# Patient Record
Sex: Female | Born: 1985 | Race: White | Hispanic: No | Marital: Married | Smoking: Never smoker
Health system: Southern US, Community
[De-identification: ages and names within clinical notes are randomized; demographics above are authoritative.]

## PROBLEM LIST (undated history)

## (undated) DIAGNOSIS — Z789 Other specified health status: Secondary | ICD-10-CM

## (undated) DIAGNOSIS — Z3483 Encounter for supervision of other normal pregnancy, third trimester: Secondary | ICD-10-CM

## (undated) DIAGNOSIS — R7982 Elevated C-reactive protein (CRP): Secondary | ICD-10-CM

## (undated) DIAGNOSIS — R000 Tachycardia, unspecified: Secondary | ICD-10-CM

## (undated) DIAGNOSIS — M791 Myalgia, unspecified site: Secondary | ICD-10-CM

## (undated) DIAGNOSIS — I712 Thoracic aortic aneurysm, without rupture, unspecified: Secondary | ICD-10-CM

## (undated) DIAGNOSIS — R0789 Other chest pain: Secondary | ICD-10-CM

## (undated) HISTORY — DX: Myalgia, unspecified site: M79.10

## (undated) HISTORY — DX: Other chest pain: R07.89

## (undated) HISTORY — DX: Thoracic aortic aneurysm, without rupture: I71.2

## (undated) HISTORY — DX: Thoracic aortic aneurysm, without rupture, unspecified: I71.20

## (undated) HISTORY — DX: Tachycardia, unspecified: R00.0

## (undated) HISTORY — DX: Other specified health status: Z78.9

## (undated) HISTORY — DX: Elevated C-reactive protein (CRP): R79.82

## (undated) HISTORY — PX: WISDOM TOOTH EXTRACTION: SHX21

---

## 2009-02-28 ENCOUNTER — Ambulatory Visit: Payer: Self-pay | Admitting: Obstetrics & Gynecology

## 2009-02-28 ENCOUNTER — Encounter: Payer: Self-pay | Admitting: Obstetrics & Gynecology

## 2009-03-02 ENCOUNTER — Ambulatory Visit: Payer: Self-pay | Admitting: Family Medicine

## 2009-03-09 ENCOUNTER — Ambulatory Visit: Payer: Self-pay | Admitting: Family Medicine

## 2009-04-02 LAB — CONVERTED CEMR LAB
ALT: 17 units/L (ref 0–35)
AST: 20 units/L (ref 0–37)
Alkaline Phosphatase: 63 units/L (ref 39–117)
BUN: 11 mg/dL (ref 6–23)
Basophils Relative: 0.1 % (ref 0.0–3.0)
Bilirubin, Direct: 0 mg/dL (ref 0.0–0.3)
Calcium: 9.4 mg/dL (ref 8.4–10.5)
Chloride: 103 meq/L (ref 96–112)
Cholesterol: 185 mg/dL (ref 0–200)
Creatinine, Ser: 0.7 mg/dL (ref 0.4–1.2)
Eosinophils Relative: 3.4 % (ref 0.0–5.0)
Free T4: 0.8 ng/dL (ref 0.6–1.6)
GFR calc non Af Amer: 109.91 mL/min (ref >60)
LDL Cholesterol: 104 mg/dL — ABNORMAL HIGH (ref 0–99)
Lymphocytes Relative: 48.4 % — ABNORMAL HIGH (ref 12.0–46.0)
Monocytes Absolute: 0.7 10*3/uL (ref 0.1–1.0)
Monocytes Relative: 8.1 % (ref 3.0–12.0)
Neutrophils Relative %: 40 % — ABNORMAL LOW (ref 43.0–77.0)
Platelets: 224 10*3/uL (ref 150.0–400.0)
RBC: 4.57 M/uL (ref 3.87–5.11)
Total Bilirubin: 0.7 mg/dL (ref 0.3–1.2)
Total CHOL/HDL Ratio: 3
Total Protein: 7.4 g/dL (ref 6.0–8.3)
Triglycerides: 86 mg/dL (ref 0.0–149.0)
VLDL: 17.2 mg/dL (ref 0.0–40.0)
WBC: 8.2 10*3/uL (ref 4.5–10.5)

## 2009-08-22 ENCOUNTER — Ambulatory Visit: Payer: Self-pay | Admitting: Family Medicine

## 2009-08-22 ENCOUNTER — Encounter: Payer: Self-pay | Admitting: Obstetrics & Gynecology

## 2009-08-22 LAB — CONVERTED CEMR LAB
Antibody Screen: NEGATIVE
Basophils Relative: 0 % (ref 0–1)
Eosinophils Relative: 1 % (ref 0–5)
HCT: 42.6 % (ref 36.0–46.0)
Hemoglobin: 14.4 g/dL (ref 12.0–15.0)
Hepatitis B Surface Ag: NEGATIVE
MCHC: 33.8 g/dL (ref 30.0–36.0)
Monocytes Absolute: 0.6 10*3/uL (ref 0.1–1.0)
Monocytes Relative: 6 % (ref 3–12)
Neutro Abs: 8.2 10*3/uL — ABNORMAL HIGH (ref 1.7–7.7)
RBC: 4.76 M/uL (ref 3.87–5.11)
Rh Type: POSITIVE
hCG, Beta Chain, Quant, S: 1140.1 milliintl units/mL

## 2009-09-03 ENCOUNTER — Ambulatory Visit (HOSPITAL_COMMUNITY): Admission: RE | Admit: 2009-09-03 | Discharge: 2009-09-03 | Payer: Self-pay | Admitting: Family Medicine

## 2009-09-04 ENCOUNTER — Ambulatory Visit: Payer: Self-pay | Admitting: Obstetrics & Gynecology

## 2009-09-04 ENCOUNTER — Encounter: Payer: Self-pay | Admitting: Obstetrics & Gynecology

## 2009-09-27 ENCOUNTER — Ambulatory Visit: Payer: Self-pay | Admitting: Obstetrics & Gynecology

## 2009-10-16 ENCOUNTER — Ambulatory Visit (HOSPITAL_COMMUNITY): Admission: RE | Admit: 2009-10-16 | Discharge: 2009-10-16 | Payer: Self-pay | Admitting: Obstetrics & Gynecology

## 2009-10-25 ENCOUNTER — Ambulatory Visit: Payer: Self-pay | Admitting: Obstetrics and Gynecology

## 2009-11-07 ENCOUNTER — Ambulatory Visit (HOSPITAL_COMMUNITY): Admission: RE | Admit: 2009-11-07 | Discharge: 2009-11-07 | Payer: Self-pay | Admitting: Obstetrics & Gynecology

## 2009-11-22 ENCOUNTER — Ambulatory Visit (HOSPITAL_COMMUNITY): Admission: RE | Admit: 2009-11-22 | Discharge: 2009-11-22 | Payer: Self-pay | Admitting: Obstetrics & Gynecology

## 2010-04-16 ENCOUNTER — Encounter (INDEPENDENT_AMBULATORY_CARE_PROVIDER_SITE_OTHER): Payer: Self-pay | Admitting: Obstetrics and Gynecology

## 2010-04-16 ENCOUNTER — Inpatient Hospital Stay (HOSPITAL_COMMUNITY): Admission: AD | Admit: 2010-04-16 | Discharge: 2010-04-18 | Payer: Self-pay | Admitting: Obstetrics and Gynecology

## 2011-01-03 ENCOUNTER — Ambulatory Visit
Admission: RE | Admit: 2011-01-03 | Discharge: 2011-01-03 | Payer: Self-pay | Source: Home / Self Care | Attending: Family Medicine | Admitting: Family Medicine

## 2011-01-03 DIAGNOSIS — R519 Headache, unspecified: Secondary | ICD-10-CM | POA: Insufficient documentation

## 2011-01-03 DIAGNOSIS — R51 Headache: Secondary | ICD-10-CM | POA: Insufficient documentation

## 2011-01-16 NOTE — Assessment & Plan Note (Signed)
Summary: FREQUENT HEADACHES / LFW   Vital Signs:  Patient profile:   25 year old female Height:      64 inches Weight:      116.75 pounds BMI:     20.11 Temp:     98.1 degrees F oral Pulse rate:   64 / minute Pulse rhythm:   regular BP sitting:   116 / 70  (left arm) Cuff size:   regular  Vitals Entered By: Selena Batten Dance CMA Duncan Dull) (January 03, 2011 3:04 PM) CC: Frequent headaches   History of Present Illness: has had a headache since last friday --  went away yesterday  last friday got it at work  not a lot she can take - is nursing  can only take tylenol  is not stopped up or congested  no fever , no other symptoms  no n/v  no photophobia got a little dizzy on 2 occasions   thought she got enough fluids -- 3-4 tumber glasses   headache sometimes comes and goes  frontal and behind eyes - bilateral  not throbbing  not severe   occ one sided when really mild  never had migraine  baby is 8 months   is having 2nd peroid - last week and cramped last night and some spotting  baby is not a good sleeper   Allergies (verified): No Known Drug Allergies  Past History:  Past Medical History: Last updated: 03/02/2009 none  Past Surgical History: Last updated: 03/02/2009 none  Family History: Last updated: 03/02/2009 F, CAD, MI at 51, AICD, CVA PGF, d/c MI at 88 M: Stage IV Melanoma Uncle, MC replacement HTN CHOL  Social History: Last updated: 03/02/2009 Married, husband Drew. 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 Eyes:  Denies blurring, double vision, and eye irritation. ENT:  Denies nasal congestion and sinus pressure. CV:  Denies chest pain or discomfort and palpitations. Resp:  Denies cough and shortness of breath. GI:  Denies abdominal pain,  change in bowel habits, nausea, and vomiting. MS:  Denies joint pain, joint redness, and joint swelling. Derm:  Denies lesion(s), poor wound healing, and rash. Neuro:  Complains of headaches; denies difficulty with concentration, disturbances in coordination, falling down, memory loss, numbness, sensation of room spinning, tingling, visual disturbances, and weakness. Psych:  Denies anxiety and depression. Endo:  Denies excessive thirst and excessive urination. Heme:  Denies abnormal bruising and bleeding.  Physical Exam  General:  slim and well apprearing  without headache today Head:  normocephalic, atraumatic, and no abnormalities observed.   Eyes:  vision grossly intact, pupils equal, pupils round, and pupils reactive to light.  no conjunctival pallor, injection or icterus  Ears:  R ear normal and L ear normal.   Nose:  no nasal discharge.   Mouth:  pharynx pink and moist.   Neck:  supple with full rom and no masses or thyromegally, no JVD or carotid bruit  Chest Wall:  No deformities, masses, or tenderness noted. Lungs:  Normal respiratory effort, chest expands symmetrically. Lungs are clear to auscultation, no crackles or wheezes. Heart:  Normal rate and regular rhythm. S1 and S2 normal without gallop, murmur, click, rub or other extra sounds. Msk:  No deformity or scoliosis noted of thoracic or lumbar spine.  no acute joint changes  Extremities:  No clubbing, cyanosis, edema, or deformity noted with normal full range of motion of all joints.   Neurologic:  No cranial nerve deficits noted. Station and gait are normal. Plantar reflexes are down-going bilaterally. DTRs are symmetrical throughout. Sensory, motor and coordinative functions appear intact. Skin:  Intact without suspicious lesions or rashes Cervical Nodes:  No lymphadenopathy noted Psych:  normal affect, talkative and pleasant  not seemingly depressed or anxious    Impression & Recommendations:  Problem # 1:  HEADACHE  (ICD-784.0) Assessment New I do think this is a mixed picture with several triggers (incl dehydration/ fatigue/ eye strain/ hormonal flux)/ analgesic rebound  not a lot of migriane features given handout on lifestyle change aafp  inc water to at least 10 glasses a day while nursing update if worse or new symptoms   Complete Medication List: 1)  Prenatal/folic Acid Tabs (Prenatal vit-fe fumarate-fa) .Marland Kitchen.. 1 by mouth once daily   Patient Instructions: 1)  drink more water-- aim for 10 of those glasses per day 2)  avoid caffine as a rule  3)  aim for more sleep  4)  tylenol is ok occasionally  5)  there may be hormones involved    Orders Added: 1)  Est. Patient Level IV [96045]    Current Allergies (reviewed today): No known allergies

## 2011-03-04 LAB — COMPREHENSIVE METABOLIC PANEL
BUN: 2 mg/dL — ABNORMAL LOW (ref 6–23)
Calcium: 9.5 mg/dL (ref 8.4–10.5)
Glucose, Bld: 102 mg/dL — ABNORMAL HIGH (ref 70–99)
Total Protein: 6.5 g/dL (ref 6.0–8.3)

## 2011-03-04 LAB — CBC
HCT: 32.7 % — ABNORMAL LOW (ref 36.0–46.0)
HCT: 38.5 % (ref 36.0–46.0)
Hemoglobin: 11.1 g/dL — ABNORMAL LOW (ref 12.0–15.0)
MCV: 92 fL (ref 78.0–100.0)
Platelets: 304 10*3/uL (ref 150–400)
Platelets: 342 10*3/uL (ref 150–400)
RDW: 13.2 % (ref 11.5–15.5)
WBC: 27.3 10*3/uL — ABNORMAL HIGH (ref 4.0–10.5)

## 2011-03-04 LAB — URINALYSIS, DIPSTICK ONLY
Glucose, UA: NEGATIVE mg/dL
Leukocytes, UA: NEGATIVE
Protein, ur: NEGATIVE mg/dL
Specific Gravity, Urine: 1.015 (ref 1.005–1.030)
pH: 7 (ref 5.0–8.0)

## 2011-03-04 LAB — LACTATE DEHYDROGENASE: LDH: 232 U/L (ref 94–250)

## 2011-03-04 LAB — URIC ACID: Uric Acid, Serum: 4.3 mg/dL (ref 2.4–7.0)

## 2011-04-29 NOTE — Assessment & Plan Note (Signed)
Brandy Ramirez, Brandy Ramirez               ACCOUNT NO.:  [id number]   MEDICAL RECORD NO.:  [id number]          PATIENT TYPE:  POB   LOCATION:  [facility] at [facility]         FACILITY:  [facility]   PHYSICIAN:  Johnella Moloney, MD        DATE OF BIRTH:  10/27/1986   DATE OF SERVICE:                                  CLINIC NOTE   CHIEF COMPLAINT:  The patient is here for annual examination.   HISTORY OF PRESENT ILLNESS:  The patient is a 25 year old, gravida 0,  with a last menstrual period of February 01, 2009, who is here for her  annual physical examination and initiation of gynecologic care.  The  patient has no gynecologic concerns.  She is currently trying to get  pregnant and has been off of any contraceptions in the past 3 months.  The patient is taking multivitamin daily.  She has a history of being  vaccinated for chickenpox and has no other concerns.  Since her  cessation of contraception, she has had periods that have come between  28 days and 35 days.  Her last period according to her was 1 week late,  but her periods last 2-3 days and are characterized by a light flow and  associated with moderate cramping, which is controlled using Motrin.  The patient has no intermenstrual bleeding or any other concerns.   PAST OB/GYN HISTORY:  As stated in the HPI.  Her menarche was at age 13.  She has used oral contraceptive pills in the past for contraception.  The patient is only sexually active with her husband.  Her last Pap  smear was in 2008 and was normal.  She has never had a history of  cervical dysplasia or she and her husband are each other is only sexual  partners.   PAST MEDICAL HISTORY:  None.   PAST SURGICAL HISTORY:  None.   MEDICATIONS:  Multivitamins daily.   ALLERGIES:  No known drug allergies.   SOCIAL HISTORY:  The patient lives with her husband.  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  She denies any smoking, alcohol, or  illicit drug use.  She also denies any past  or current history of sexual  physical or emotional abuse.   REVIEW OF SYSTEMS:  A 14-point comprehensive review of systems was  discussed with the patient and was entirely negative.   PHYSICAL EXAMINATION:  VITAL SIGNS:  Blood pressure 138/91, pulse 72,  and weight 115 pounds.  GENERAL:  No apparent distress.  HEENT:  Normocephalic, atraumatic.  NECK:  Supple.  No masses.  Normal thyroid.  BREASTS:  Symmetric in size and nontender.  No abnormal masses,  drainage, skin changes, or lymphadenopathy noted.  HEART:  Regular rate and rhythm.  LUNGS:  Clear to auscultation bilaterally.  ABDOMEN:  Soft, nontender, and nondistended.  EXTREMITIES:  No cyanosis, clubbing, or edema.  PELVIC:  Normal external female genitalia.  Pink and well rugated  vagina.  Nulliparous cervix.  No lesions seen.  Normal discharge.  Pap  smear was obtained.  BIMANUAL:  The patient has a normal mobile uterus and normal adnexa  bilaterally  and no tenderness on examination.   ASSESSMENT AND PLAN:  The patient is a 25 year old, gravida 0, here for  her annual physical examination.  The patient underwent a Pap smear  today.  We will follow up results.  She also had a normal breast  examination.  Of note on family history, it is notable for heart disease  and skin cancer.  There is no history of any breast, gynecologic, or  colon cancers in her family.  The patient is also trying to conceive.  She was told to continue to take a multivitamins daily and she has been  vaccinated for chicken pox and has no other preconceptual concerns.  She  was told to call and make that clinic appointment once she is pregnant.  She initiate prenatal care and to also call for any other obstetric or  gynecologic concerns.  Given her age, the patient was informed and  counseled about the availability of the Gardasil vaccine; however, given  that she and her husband are each other is only sexual partner.  Her  likelihood of contacting  HPV is very, very low.  The patient will think  about this and make a decision about vaccine.           ______________________________  Johnella Moloney, MD     UD/MEDQ  D:  02/28/2009  T:  02/28/2009  Job:  [id number]

## 2011-06-30 ENCOUNTER — Ambulatory Visit (INDEPENDENT_AMBULATORY_CARE_PROVIDER_SITE_OTHER): Payer: 59 | Admitting: Family Medicine

## 2011-06-30 ENCOUNTER — Encounter: Payer: Self-pay | Admitting: Family Medicine

## 2011-06-30 VITALS — BP 100/66 | HR 71 | Temp 98.4°F | Ht 63.0 in | Wt 114.1 lb

## 2011-06-30 DIAGNOSIS — R197 Diarrhea, unspecified: Secondary | ICD-10-CM

## 2011-06-30 DIAGNOSIS — Z331 Pregnant state, incidental: Secondary | ICD-10-CM

## 2011-06-30 NOTE — Progress Notes (Signed)
Brandy Ramirez, a 25 y.o. female presents today in the office for the following:   [redacted] weeks pregnant  Almost a week and a half, has had severe abdominal cramps, called OB. No immodium per rec Daughter had some illness and bad diarrhea. Daughter had some diarrhea for 2-3 weeks.   For patient: Manson Passey yellow, soft -- poorly formed. All started and was green and shiny.  No camping or other exposures. 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  Works with [employer]  No n/v. Drinking very easily.  Patient Active Problem List  Diagnoses  . HEADACHE   No past medical history on file. No past surgical history on file. History  Substance Use Topics  . Smoking status: Never Smoker   . Smokeless tobacco: Not on file  . Alcohol Use: No   Family History  Problem Relation Age of Onset  . Cancer Mother     stage IV MELANOMA  . Coronary artery disease Father   . Heart attack Father   . Stroke Father   . Heart attack Paternal Grandfather    No Known Allergies No current outpatient prescriptions on file prior to visit.   ROS: GEN: no fevers, chills. GI: above Pulm: No SOB Interactive and getting along well at home.  Otherwise, ROS is as per the HPI.    Physical Exam  Blood pressure 100/66, pulse 71, temperature 98.4 F (36.9 C), temperature source Oral, height 5\' 3"  (1.6 m), weight 114 lb 1.9 oz (51.764 kg), SpO2 100.00%.  GEN: WDWN, NAD, Non-toxic, A & O x 3 HEENT: Atraumatic, Normocephalic. Neck supple. No masses, No LAD. Ears and Nose: No external deformity. ABD: S, NT, ND, +BS. No rebound tenderness. No HSM.  EXTR: No c/c/e NEURO Normal gait.  PSYCH: Normally interactive. Conversant. Not depressed or anxious appearing.  Calm demeanor.   Assessment and Plan: 1.  Diarrhea. Unclear cause. Most likely infectious. Given pregnancy, higher level of concern - check stool studies. No camping exposure. If all negative, would let go its course. Push fluids.

## 2011-07-03 LAB — FECAL LACTOFERRIN, QUANT: Lactoferrin: NEGATIVE

## 2011-07-06 LAB — STOOL CULTURE

## 2011-07-09 LAB — GC/CHLAMYDIA PROBE AMP, GENITAL: Chlamydia: NEGATIVE

## 2011-08-21 ENCOUNTER — Other Ambulatory Visit (HOSPITAL_COMMUNITY): Payer: Self-pay | Admitting: Obstetrics and Gynecology

## 2011-08-21 DIAGNOSIS — Z3689 Encounter for other specified antenatal screening: Secondary | ICD-10-CM

## 2011-08-22 ENCOUNTER — Ambulatory Visit (HOSPITAL_COMMUNITY): Admission: RE | Admit: 2011-08-22 | Payer: 59 | Source: Ambulatory Visit

## 2011-08-22 ENCOUNTER — Ambulatory Visit (HOSPITAL_COMMUNITY): Payer: 59

## 2011-12-16 NOTE — L&D Delivery Note (Signed)
Delivery Note Pt had SROM, continued to progress to complete and pushed very well.  At 11:52 PM a viable female was delivered via Vaginal, Spontaneous Delivery (Presentation: ; Occiput Anterior).  APGAR: 9, 9; weight 7 lbs 3 oz.   Placenta status: Intact, Spontaneous.  Cord: 3 vessels with the following complications: None.  Anesthesia: Local  Episiotomy: None Lacerations: 2nd degree Suture Repair: 2.0 vicryl Est. Blood Loss (mL): 400  Mom to postpartum.  Baby to nursery-stable.  Brandy Ramirez D 01/19/2012, 12:13 AM

## 2012-01-12 LAB — STREP B DNA PROBE: GBS: POSITIVE

## 2012-01-16 ENCOUNTER — Telehealth (HOSPITAL_COMMUNITY): Payer: Self-pay | Admitting: *Deleted

## 2012-01-16 ENCOUNTER — Encounter (HOSPITAL_COMMUNITY): Payer: Self-pay | Admitting: *Deleted

## 2012-01-16 NOTE — Telephone Encounter (Signed)
Preadmission screen  

## 2012-01-18 ENCOUNTER — Encounter (HOSPITAL_COMMUNITY): Payer: Self-pay | Admitting: *Deleted

## 2012-01-18 ENCOUNTER — Inpatient Hospital Stay (HOSPITAL_COMMUNITY)
Admission: AD | Admit: 2012-01-18 | Discharge: 2012-01-20 | DRG: 775 | Disposition: A | Discharge status: Home / Self Care | Payer: 59 | Source: Ambulatory Visit | Attending: Obstetrics and Gynecology | Admitting: Obstetrics and Gynecology

## 2012-01-18 DIAGNOSIS — IMO0001 Reserved for inherently not codable concepts without codable children: Secondary | ICD-10-CM

## 2012-01-18 DIAGNOSIS — Z22330 Carrier of Group B streptococcus: Secondary | ICD-10-CM

## 2012-01-18 DIAGNOSIS — O99892 Other specified diseases and conditions complicating childbirth: Secondary | ICD-10-CM | POA: Diagnosis present

## 2012-01-18 LAB — CBC
Hemoglobin: 13.5 g/dL (ref 12.0–15.0)
MCHC: 33.9 g/dL (ref 30.0–36.0)
RBC: 4.33 MIL/uL (ref 3.87–5.11)
WBC: 16.9 10*3/uL — ABNORMAL HIGH (ref 4.0–10.5)

## 2012-01-18 MED ORDER — IBUPROFEN 600 MG PO TABS
600.0000 mg | ORAL_TABLET | Freq: Four times a day (QID) | ORAL | Status: DC | PRN
  Administered 2012-01-19: 600 mg via ORAL
  Filled 2012-01-18: qty 1

## 2012-01-18 MED ORDER — LIDOCAINE HCL (PF) 1 % IJ SOLN
30.0000 mL | INTRAMUSCULAR | Status: DC | PRN
  Administered 2012-01-18: 30 mL via SUBCUTANEOUS
  Filled 2012-01-18: qty 30

## 2012-01-18 MED ORDER — OXYTOCIN 20 UNITS IN LACTATED RINGERS INFUSION - SIMPLE
125.0000 mL/h | Freq: Once | INTRAVENOUS | Status: AC
  Administered 2012-01-18: 999 mL/h via INTRAVENOUS

## 2012-01-18 MED ORDER — CITRIC ACID-SODIUM CITRATE 334-500 MG/5ML PO SOLN: 30.0000 mL | ORAL | Status: DC | PRN

## 2012-01-18 MED ORDER — ONDANSETRON HCL 4 MG/2ML IJ SOLN: 4.0000 mg | Freq: Four times a day (QID) | INTRAMUSCULAR | Status: DC | PRN

## 2012-01-18 MED ORDER — ACETAMINOPHEN 325 MG PO TABS: 650.0000 mg | ORAL_TABLET | ORAL | Status: DC | PRN

## 2012-01-18 MED ORDER — PENICILLIN G POTASSIUM 5000000 UNITS IJ SOLR
2.5000 10*6.[IU] | INTRAVENOUS | Status: DC
  Administered 2012-01-18: 2.5 10*6.[IU] via INTRAVENOUS
  Filled 2012-01-18 (×5): qty 2.5

## 2012-01-18 MED ORDER — FLEET ENEMA 7-19 GM/118ML RE ENEM: 1.0000 | ENEMA | RECTAL | Status: DC | PRN

## 2012-01-18 MED ORDER — OXYTOCIN BOLUS FROM INFUSION
500.0000 mL | Freq: Once | INTRAVENOUS | Status: DC
  Filled 2012-01-18: qty 1000
  Filled 2012-01-18: qty 500

## 2012-01-18 MED ORDER — PENICILLIN G POTASSIUM 5000000 UNITS IJ SOLR
5.0000 10*6.[IU] | Freq: Once | INTRAMUSCULAR | Status: AC
  Administered 2012-01-18: 5 10*6.[IU] via INTRAVENOUS
  Filled 2012-01-18: qty 5

## 2012-01-18 MED ORDER — OXYCODONE-ACETAMINOPHEN 5-325 MG PO TABS
1.0000 | ORAL_TABLET | ORAL | Status: DC | PRN
  Administered 2012-01-19: 1 via ORAL
  Filled 2012-01-18: qty 1

## 2012-01-18 MED ORDER — LACTATED RINGERS IV SOLN: 500.0000 mL | INTRAVENOUS | Status: DC | PRN

## 2012-01-18 MED ORDER — LACTATED RINGERS IV SOLN
INTRAVENOUS | Status: DC
  Administered 2012-01-18: 20:00:00 via INTRAVENOUS

## 2012-01-18 NOTE — H&P (Signed)
Brandy Ramirez is a 26 y.o. female, G2 P1001, EGA 39+ weeks presenting for evaluation of regular ctx.  On eval in MAU, cervix changed from 2 to 4 cm with reg ctx.  Prenatal care essentially uncomplicated, see prenatal records for complete history.  Maternal Medical History:  Reason for admission: Reason for admission: contractions.  Contractions: Frequency: regular.   Perceived severity is moderate.    Fetal activity: Perceived fetal activity is normal.    Prenatal complications: no prenatal complications   OB History    Grav Para Term Preterm Abortions TAB SAB Ect Mult Living   2 1 1       1     SVD at 38 weeks, 7 lbs, no complications  Past Medical History  Diagnosis Date  . No pertinent past medical history    Past Surgical History  Procedure Date  . No past surgeries    Family History: family history includes Birth defects in her daughter; Cancer in her father, maternal grandmother, and mother; Coronary artery disease in her father; Heart attack in her father and paternal grandfather; and Stroke in her father. Social History:  reports that she has never smoked. She has never used smokeless tobacco. She reports that she does not drink alcohol or use illicit drugs.  Review of Systems  Respiratory: Negative.   Cardiovascular: Negative.     Dilation: 4 Effacement (%): 80 Station: -2 Exam by:: [employer] RN Blood pressure 142/92, pulse 107, temperature 98.5 F (36.9 C), temperature source Oral, resp. rate 20, height 5\' 3"  (1.6 m), weight 66.407 kg (146 lb 6.4 oz), last menstrual period 07/09/2011. Maternal Exam:  Uterine Assessment: Contraction strength is moderate.  Contraction frequency is regular.   Abdomen: Patient reports no abdominal tenderness. Estimated fetal weight is 7 lbs.   Fetal presentation: vertex  Introitus: Normal vulva. Normal vagina.  Pelvis: adequate for delivery.   Cervix: Cervix evaluated by digital exam.     Fetal Exam Fetal Monitor Review: Mode:  ultrasound.   Baseline rate: 130.  Variability: moderate (6-25 bpm).   Pattern: accelerations present and no decelerations.    Fetal State Assessment: Category I - tracings are normal.     Physical Exam  Constitutional: She appears well-developed and well-nourished.  Cardiovascular: Normal rate, regular rhythm and normal heart sounds.   No murmur heard. Respiratory: Effort normal and breath sounds normal. No respiratory distress. She has no wheezes.  GI: Soft.       gravid    Prenatal labs: ABO, Rh:  O pos Antibody:  neg Rubella:  Imm RPR: Nonreactive (07/25 0000)  HBsAg:   neg HIV: Non-reactive (07/25 0000)  GBS: Positive (01/28 0000)   Assessment/Plan: IUP at 39 weeks in early active labor, GBS positive.  Will admit and start PCN, AROM after 3 hrs of PCN to allow time for it to take effect.     Brandy Ramirez 01/18/2012, 8:19 PM

## 2012-01-18 NOTE — Progress Notes (Signed)
Pt reports having contracts since 1430 . More regular now q 3-4 min. Deneis SROM  Or bleeding at this time. Reports good fetal movement.

## 2012-01-19 ENCOUNTER — Encounter (HOSPITAL_COMMUNITY): Payer: Self-pay | Admitting: *Deleted

## 2012-01-19 LAB — RPR: RPR Ser Ql: NONREACTIVE

## 2012-01-19 MED ORDER — BENZOCAINE-MENTHOL 20-0.5 % EX AERO
INHALATION_SPRAY | CUTANEOUS | Status: AC
  Administered 2012-01-19: 1 via TOPICAL
  Filled 2012-01-19: qty 56

## 2012-01-19 MED ORDER — ONDANSETRON HCL 4 MG PO TABS: 4.0000 mg | ORAL_TABLET | ORAL | Status: DC | PRN

## 2012-01-19 MED ORDER — WITCH HAZEL-GLYCERIN EX PADS: 1.0000 "application " | MEDICATED_PAD | CUTANEOUS | Status: DC | PRN

## 2012-01-19 MED ORDER — SIMETHICONE 80 MG PO CHEW: 80.0000 mg | CHEWABLE_TABLET | ORAL | Status: DC | PRN

## 2012-01-19 MED ORDER — DIBUCAINE 1 % RE OINT: 1.0000 "application " | TOPICAL_OINTMENT | RECTAL | Status: DC | PRN

## 2012-01-19 MED ORDER — MAGNESIUM HYDROXIDE 400 MG/5ML PO SUSP: 30.0000 mL | ORAL | Status: DC | PRN

## 2012-01-19 MED ORDER — MEASLES, MUMPS & RUBELLA VAC ~~LOC~~ INJ
0.5000 mL | INJECTION | Freq: Once | SUBCUTANEOUS | Status: DC
  Filled 2012-01-19: qty 0.5

## 2012-01-19 MED ORDER — LANOLIN HYDROUS EX OINT: TOPICAL_OINTMENT | CUTANEOUS | Status: DC | PRN

## 2012-01-19 MED ORDER — BENZOCAINE-MENTHOL 20-0.5 % EX AERO
1.0000 "application " | INHALATION_SPRAY | CUTANEOUS | Status: DC | PRN
  Administered 2012-01-19: 1 via TOPICAL

## 2012-01-19 MED ORDER — PRENATAL MULTIVITAMIN CH
1.0000 | ORAL_TABLET | Freq: Every day | ORAL | Status: DC
  Administered 2012-01-19 – 2012-01-20 (×2): 1 via ORAL
  Filled 2012-01-19 (×2): qty 1

## 2012-01-19 MED ORDER — SENNOSIDES-DOCUSATE SODIUM 8.6-50 MG PO TABS
2.0000 | ORAL_TABLET | Freq: Every day | ORAL | Status: DC
  Administered 2012-01-19: 2 via ORAL

## 2012-01-19 MED ORDER — POVIDONE-IODINE 10 % EX OINT
TOPICAL_OINTMENT | CUTANEOUS | Status: AC
  Filled 2012-01-19: qty 28.35

## 2012-01-19 MED ORDER — OXYTOCIN 20 UNITS IN LACTATED RINGERS INFUSION - SIMPLE: 125.0000 mL/h | INTRAVENOUS | Status: DC | PRN

## 2012-01-19 MED ORDER — METHYLERGONOVINE MALEATE 0.2 MG/ML IJ SOLN: 0.2000 mg | INTRAMUSCULAR | Status: DC | PRN

## 2012-01-19 MED ORDER — IBUPROFEN 600 MG PO TABS
600.0000 mg | ORAL_TABLET | Freq: Four times a day (QID) | ORAL | Status: DC
  Administered 2012-01-19 – 2012-01-20 (×5): 600 mg via ORAL
  Filled 2012-01-19 (×5): qty 1

## 2012-01-19 MED ORDER — TETANUS-DIPHTH-ACELL PERTUSSIS 5-2.5-18.5 LF-MCG/0.5 IM SUSP
0.5000 mL | Freq: Once | INTRAMUSCULAR | Status: AC
  Administered 2012-01-20: 0.5 mL via INTRAMUSCULAR
  Filled 2012-01-19: qty 0.5

## 2012-01-19 MED ORDER — DIPHENHYDRAMINE HCL 25 MG PO CAPS: 25.0000 mg | ORAL_CAPSULE | Freq: Four times a day (QID) | ORAL | Status: DC | PRN

## 2012-01-19 MED ORDER — OXYCODONE-ACETAMINOPHEN 5-325 MG PO TABS: 1.0000 | ORAL_TABLET | ORAL | Status: DC | PRN

## 2012-01-19 MED ORDER — METHYLERGONOVINE MALEATE 0.2 MG PO TABS: 0.2000 mg | ORAL_TABLET | ORAL | Status: DC | PRN

## 2012-01-19 MED ORDER — ZOLPIDEM TARTRATE 5 MG PO TABS: 5.0000 mg | ORAL_TABLET | Freq: Every evening | ORAL | Status: DC | PRN

## 2012-01-19 MED ORDER — ONDANSETRON HCL 4 MG/2ML IJ SOLN: 4.0000 mg | INTRAMUSCULAR | Status: DC | PRN

## 2012-01-19 NOTE — Progress Notes (Signed)
PPD #1 No problems Afeb, VSS Fundus firm, NT at U-1 Continue routine postpartum care.  Discussed circumcision procedure and risks last pm after delivery.

## 2012-01-20 ENCOUNTER — Inpatient Hospital Stay (HOSPITAL_COMMUNITY): Admission: RE | Admit: 2012-01-20 | Payer: 59 | Source: Ambulatory Visit

## 2012-01-20 NOTE — Discharge Summary (Signed)
Obstetric Discharge Summary Reason for Admission: onset of labor Prenatal Procedures: none Intrapartum Procedures: spontaneous vaginal delivery Postpartum Procedures: none Complications-Operative and Postpartum: 2nd degree perineal laceration Hemoglobin  Date Value Range Status  01/18/2012 13.5  12.0-15.0 (g/dL) Final     HCT  Date Value Range Status  01/18/2012 39.8  36.0-46.0 (%) Final    Discharge Diagnoses: Term Pregnancy-delivered  Discharge Information: Date: 01/20/2012 Activity: pelvic rest Diet: routine Medications: Ibuprofen Condition: stable Instructions: refer to practice specific booklet Discharge to: home Follow-up Information    Follow up with Omara Alcon D, MD. Schedule an appointment as soon as possible for a visit in 6 weeks.   Contact information:   [address] [place] [address] [phone]          Newborn Data: Live born female  Birth Weight: 7 lb 2.8 oz (3255 g) APGAR: 9, 9  Home with mother.  Anjolina Byrer D 01/20/2012, 10:15 AM

## 2012-01-20 NOTE — Progress Notes (Signed)
PPD #2 No problems Afeb, VSS D/c home 

## 2012-11-05 ENCOUNTER — Telehealth: Payer: Self-pay

## 2012-11-05 NOTE — Telephone Encounter (Signed)
Pt request appt for filling out healthy rewards insurance discount form and will be fasting for labs also. Pt sheduled 11/15/12 at 8:15 am.

## 2012-11-15 ENCOUNTER — Ambulatory Visit: Payer: 59 | Admitting: Family Medicine

## 2012-11-17 ENCOUNTER — Ambulatory Visit: Payer: 59 | Admitting: Family Medicine

## 2012-11-18 ENCOUNTER — Ambulatory Visit (INDEPENDENT_AMBULATORY_CARE_PROVIDER_SITE_OTHER): Payer: 59 | Admitting: Family Medicine

## 2012-11-18 ENCOUNTER — Encounter: Payer: Self-pay | Admitting: Family Medicine

## 2012-11-18 VITALS — BP 100/60 | HR 73 | Temp 99.3°F | Ht 63.0 in | Wt 116.5 lb

## 2012-11-18 DIAGNOSIS — Z131 Encounter for screening for diabetes mellitus: Secondary | ICD-10-CM

## 2012-11-18 DIAGNOSIS — Z13220 Encounter for screening for lipoid disorders: Secondary | ICD-10-CM

## 2012-11-18 DIAGNOSIS — J029 Acute pharyngitis, unspecified: Secondary | ICD-10-CM

## 2012-11-18 LAB — BASIC METABOLIC PANEL
CO2: 29 mEq/L (ref 19–32)
Calcium: 9 mg/dL (ref 8.4–10.5)
Creatinine, Ser: 0.6 mg/dL (ref 0.4–1.2)
Sodium: 138 mEq/L (ref 135–145)

## 2012-11-18 LAB — LIPID PANEL
HDL: 74.4 mg/dL (ref >39.00)
LDL Cholesterol: 89 mg/dL (ref 0–99)
Total CHOL/HDL Ratio: 2

## 2012-11-18 MED ORDER — PENICILLIN V POTASSIUM 500 MG PO TABS: 500.0000 mg | ORAL_TABLET | Freq: Three times a day (TID) | ORAL | Status: DC

## 2012-11-18 NOTE — Progress Notes (Signed)
Nature conservation officer at [facility] [address] [place] [address] Phone: [phone] Fax: [phone]  Date:  11/18/2012   Name:  Chalese Peach   DOB:  [birth year]   MRN:  [id number] Gender: female Age: 26 y.o.  PCP:  Hannah Beat, MD  Evaluating MD: Hannah Beat, MD   Chief Complaint: labs for work   History of Present Illness:  Brandy Ramirez is a 26 y.o. pleasant patient who presents with the following:  The patient has had a relatively severe sore throat over the past few days with an absence of other symptoms including an absence of URI symptoms and does have painful lymph nodes in her neck.  Patient also is here for some lab work screening for her health insurance.  Patient Active Problem List  Diagnosis  . HEADACHE  . Active labor  . SVD (spontaneous vaginal delivery)    Past Medical History  Diagnosis Date  . No pertinent past medical history     Past Surgical History  Procedure Date  . No past surgeries     History  Substance Use Topics  . Smoking status: Never Smoker   . Smokeless tobacco: Never Used  . Alcohol Use: No    Family History  Problem Relation Age of Onset  . Cancer Mother     stage IV MELANOMA  . Coronary artery disease Father   . Heart attack Father   . Stroke Father   . Cancer Father     non hodgkins lymphoma  . Heart attack Paternal Grandfather   . Birth defects Daughter     VSD  . Cancer Maternal Grandmother     colon    No Known Allergies  Medication list has been reviewed and updated.  Outpatient Prescriptions Prior to Visit  Medication Sig Dispense Refill  . Prenatal Vit-Fe Psac Cmplx-FA (PRENATAL MULTIVITAMIN) 60-1 MG tablet Take 1 tablet by mouth daily with breakfast.         Last reviewed on 11/18/2012  9:28 AM by Consuello Masse, CMA  Review of Systems:  ROS: GEN: Acute illness details above GI: Tolerating PO intake GU: maintaining adequate hydration and urination Pulm: No  SOB Interactive and getting along well at home.  Otherwise, ROS is as per the HPI.   Physical Examination: Filed Vitals:   11/18/12 0927  BP: 100/60  Pulse: 73  Temp: 99.3 F (37.4 C)  TempSrc: Oral  Height: 5\' 3"  (1.6 m)  Weight: 116 lb 8 oz (52.844 kg)  SpO2: 98%    Body mass index is 20.64 kg/(m^2). Ideal Body Weight: Weight in (lb) to have BMI = 25: 140.8    Gen: WDWN, NAD; A & O x3, cooperative. Pleasant.Globally Non-toxic HEENT: Normocephalic and atraumatic. Throat: no exudate R TM clear, L TM - good landmarks, No fluid present. rhinnorhea. No frontal or maxillary sinus T. MMM NECK: Anterior cervical  LAD is present - TTP CV: RRR, No M/G/R, cap refill <2 sec PULM: Breathing comfortably in no respiratory distress. no wheezing, crackles, rhonchi ABD: S,NT,ND,+BS. No HSM. No rebound. EXT: No c/c/e PSYCH: Friendly, good eye contact   Assessment and Plan:  1. Sore throat  POCT rapid strep A  2. Screening for lipoid disorders  Lipid panel  3. Screening for diabetes mellitus  Basic metabolic panel   Most likely streptococcal for angitis, could be viral pharyngitis. Given high pretest probability and in Stentor criteria, will go ahead and treat with penicillin. More likely strep test is  falsely negative.  Lab work for work.  Results for orders placed in visit on 11/18/12  POCT RAPID STREP A (OFFICE)      Component Value Range   Rapid Strep A Screen Negative  Negative  LIPID PANEL      Component Value Range   Cholesterol 170  0 - 200 mg/dL   Triglycerides 27.2  0.0 - 149.0 mg/dL   HDL 53.66  >44.03 mg/dL   VLDL 6.2  0.0 - 47.4 mg/dL   LDL Cholesterol 89  0 - 99 mg/dL   Total CHOL/HDL Ratio 2    BASIC METABOLIC PANEL      Component Value Range   Sodium 138  135 - 145 mEq/L   Potassium 3.9  3.5 - 5.1 mEq/L   Chloride 103  96 - 112 mEq/L   CO2 29  19 - 32 mEq/L   Glucose, Bld 83  70 - 99 mg/dL   BUN 8  6 - 23 mg/dL   Creatinine, Ser 0.6  0.4 - 1.2 mg/dL    Calcium 9.0  8.4 - 25.9 mg/dL   GFR 563.87  >56.43 mL/min     Orders Today:  Orders Placed This Encounter  Procedures  . Lipid panel  . Basic metabolic panel  . POCT rapid strep A    Updated Medication List: (Includes new medications, updates to list, dose adjustments) Meds ordered this encounter  Medications  . penicillin v potassium (VEETID) 500 MG tablet    Sig: Take 1 tablet (500 mg total) by mouth 3 (three) times daily.    Dispense:  30 tablet    Refill:  0    Medications Discontinued: There are no discontinued medications.   Hannah Beat, MD

## 2013-08-31 ENCOUNTER — Ambulatory Visit (INDEPENDENT_AMBULATORY_CARE_PROVIDER_SITE_OTHER): Payer: 59 | Admitting: Family Medicine

## 2013-08-31 ENCOUNTER — Encounter: Payer: Self-pay | Admitting: Family Medicine

## 2013-08-31 VITALS — BP 110/80 | HR 65 | Temp 98.4°F | Ht 63.5 in | Wt 116.2 lb

## 2013-08-31 DIAGNOSIS — Z0000 Encounter for general adult medical examination without abnormal findings: Secondary | ICD-10-CM

## 2013-08-31 DIAGNOSIS — Z131 Encounter for screening for diabetes mellitus: Secondary | ICD-10-CM

## 2013-08-31 DIAGNOSIS — Z13220 Encounter for screening for lipoid disorders: Secondary | ICD-10-CM

## 2013-08-31 DIAGNOSIS — Z23 Encounter for immunization: Secondary | ICD-10-CM

## 2013-08-31 LAB — BASIC METABOLIC PANEL
BUN: 8 mg/dL (ref 6–23)
Chloride: 103 mEq/L (ref 96–112)
GFR: 106.06 mL/min (ref >60.00)
Potassium: 4.1 mEq/L (ref 3.5–5.1)
Sodium: 137 mEq/L (ref 135–145)

## 2013-08-31 LAB — LIPID PANEL
LDL Cholesterol: 92 mg/dL (ref 0–99)
Total CHOL/HDL Ratio: 3
Triglycerides: 75 mg/dL (ref 0.0–149.0)

## 2013-08-31 NOTE — Progress Notes (Signed)
Nature conservation officer at [facility] [address] [place] [address] Phone: [phone] Fax: [phone]  Date:  08/31/2013   Name:  Brandy Ramirez   DOB:  Jan 24, 1986   MRN:  [id number] Gender: female Age: 27 y.o.  Primary Physician:  Hannah Beat, MD  Evaluating MD: Hannah Beat, MD   Chief Complaint: Insurance Form   History of Present Illness:  Brandy Ramirez is a 27 y.o. pleasant patient who presents with the following:  3 and 19 months. 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  Health Maintenance Summary Reviewed and updated, unless pt declines services.  Tobacco History Reviewed. Non-smoker Alcohol: No concerns, no excessive use Exercise Habits: Some activity, rec at least 30 mins 5 times a week STD concerns: none Drug Use: None Birth control method: condoms Menses regular: yes Lumps or breast concerns: no Breast Cancer Family History: no  Health Maintenance  Topic Date Due  . Pap Smear  02/13/2012  . Influenza Vaccine  07/15/2013  . Tetanus/tdap  01/19/2022      Patient Active Problem List   Diagnosis Date Noted  . HEADACHE 01/03/2011    Past Medical History  Diagnosis Date  . No pertinent past medical history     Past Surgical History  Procedure Laterality Date  . No past surgeries      History   Social History  . Marital Status: Married    Spouse Name: drew    Number of Children: 0  . Years of Education: N/A   Occupational History  . 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 Smoking status: Never Smoker   . Smokeless tobacco: Never Used  . Alcohol Use: No  . Drug Use: No  . Sexual Activity: Yes   Other Topics Concern  . Not on file   Social History Narrative   Likes to walk    Family History  Problem Relation Age of Onset  . Cancer Mother     stage IV MELANOMA  . Coronary artery disease Father   . Heart attack Father   . Stroke Father   . Cancer Father     non hodgkins  lymphoma  . Heart attack Paternal Grandfather   . Birth defects Daughter     VSD  . Cancer Maternal Grandmother     colon    No Known Allergies  Medication list has been reviewed and updated.  Outpatient Prescriptions Prior to Visit  Medication Sig Dispense Refill  . Prenatal Vit-Fe Psac Cmplx-FA (PRENATAL MULTIVITAMIN) 60-1 MG tablet Take 1 tablet by mouth daily with breakfast.        . penicillin v potassium (VEETID) 500 MG tablet Take 1 tablet (500 mg total) by mouth 3 (three) times daily.  30 tablet  0   No facility-administered medications prior to visit.    Review of Systems:   General: Denies fever, chills, sweats. No significant weight loss. Eyes: Denies blurring,significant itching ENT: Denies earache, sore throat, and hoarseness.  Cardiovascular: Denies chest pains, palpitations, dyspnea on exertion,  Respiratory: Denies cough, dyspnea at rest,wheeezing Breast: no concerns about lumps GI: Denies nausea, vomiting, diarrhea, constipation, change in bowel habits, abdominal pain, melena, hematochezia GU: Denies dysuria, hematuria, urinary hesitancy, nocturia, denies STD risk, no concerns about discharge Musculoskeletal: Denies back pain, joint pain Derm: Denies rash, itching Neuro: Denies  paresthesias, frequent falls, frequent headaches Psych: Denies depression, anxiety Endocrine: Denies cold intolerance, heat intolerance, polydipsia Heme: Denies enlarged lymph nodes  Allergy: No hayfever   Physical Examination: BP 110/80  Pulse 65  Temp(Src) 98.4 F (36.9 C) (Oral)  Ht 5' 3.5" (1.613 m)  Wt 116 lb 4 oz (52.731 kg)  BMI 20.27 kg/m2  LMP 08/13/2013  Breastfeeding? No  Ideal Body Weight: Weight in (lb) to have BMI = 25: 143.1   Wt Readings from Last 3 Encounters:  08/31/13 116 lb 4 oz (52.731 kg)  11/18/12 116 lb 8 oz (52.844 kg)  01/18/12 146 lb 6.4 oz (66.407 kg)    GEN: well developed, well nourished, no acute distress Eyes: conjunctiva and lids  normal, PERRLA, EOMI ENT: TM clear, nares clear, oral exam WNL Neck: supple, no lymphadenopathy, no thyromegaly, no JVD Pulm: clear to auscultation and percussion, respiratory effort normal CV: regular rate and rhythm, S1-S2, no murmur, rub or gallop, no bruits Chest: no scars, masses, no lumps BREAST: defer GI: soft, non-tender; no hepatosplenomegaly, masses; active bowel sounds all quadrants GU: Defer Lymph: no cervical, axillary or inguinal adenopathy MSK: gait normal, muscle tone and strength WNL, no joint swelling, effusions, discoloration, crepitus  SKIN: clear, good turgor, color WNL, no rashes, lesions, or ulcerations Neuro: normal mental status, normal strength, sensation, and motion Psych: alert; oriented to person, place and time, normally interactive and not anxious or depressed in appearance.   Assessment and Plan:  Routine general medical examination at a health care facility  Need for prophylactic vaccination and inoculation against influenza - Plan: Flu Vaccine QUAD 36+ mos PF IM (Fluarix)  Screening for lipoid disorders - Plan: Lipid panel  Screening for diabetes mellitus - Plan: Basic metabolic panel  The patient's preventative maintenance and recommended screening tests for an annual wellness exam were reviewed in full today. Brought up to date unless services declined.  Counselled on the importance of diet, exercise, and its role in overall health and mortality. The patient's FH and SH was reviewed, including their home life, tobacco status, and drug and alcohol status.   Doing well  Orders Today:  Orders Placed This Encounter  Procedures  . Flu Vaccine QUAD 36+ mos PF IM (Fluarix)  . Basic metabolic panel  . Lipid panel    Updated Medication List: (Includes new medications, updates to list, dose adjustments) No orders of the defined types were placed in this encounter.    Medications Discontinued: Medications Discontinued During This Encounter    Medication Reason  . penicillin v potassium (VEETID) 500 MG tablet Completed Course      Signed, Tangelia Sanson T. Lucius Wise, MD 08/31/2013 9:13 AM

## 2013-09-01 ENCOUNTER — Encounter: Payer: Self-pay | Admitting: *Deleted

## 2013-09-01 ENCOUNTER — Telehealth: Payer: Self-pay

## 2013-09-01 NOTE — Telephone Encounter (Signed)
Pt called to get total chol 167; LDL 92;HDL 60.30 and triglycerides 75.0. These values given to pt and advised pt would receive in mail also. (unable to put under result notes).

## 2013-12-15 NOTE — L&D Delivery Note (Signed)
Delivery Note At 6:36 PM a viable and healthy female was delivered via Vaginal, Spontaneous Delivery (Presentation: Left Occiput Anterior).  APGAR: 8, 9; weight P .   Placenta status: Intact, Spontaneous.  Cord: 3 vessels with the following complications: None.    Anesthesia: Epidural  Episiotomy: None Lacerations: 2nd degree;Perineal Suture Repair: 3.0 vicryl rapide Est. Blood Loss (mL): 400cc  Mom to postpartum.  Baby to Couplet care / Skin to Skin.  Bovard-Stuckert, Brandy Ramirez 12/03/2014, 6:55 PM  Br/Tdap in PNC/O+/RI/ Contra ?  Desires circumcision for female infant including r/b/a, will proceed in AM

## 2014-05-09 LAB — OB RESULTS CONSOLE GC/CHLAMYDIA
CHLAMYDIA, DNA PROBE: NEGATIVE
Gonorrhea: NEGATIVE

## 2014-05-09 LAB — OB RESULTS CONSOLE HEPATITIS B SURFACE ANTIGEN: HEP B S AG: NEGATIVE

## 2014-05-09 LAB — OB RESULTS CONSOLE RPR: RPR: NONREACTIVE

## 2014-05-09 LAB — OB RESULTS CONSOLE ABO/RH: RH Type: POSITIVE

## 2014-05-09 LAB — OB RESULTS CONSOLE ANTIBODY SCREEN: ANTIBODY SCREEN: NEGATIVE

## 2014-05-09 LAB — OB RESULTS CONSOLE HIV ANTIBODY (ROUTINE TESTING): HIV: NONREACTIVE

## 2014-05-09 LAB — OB RESULTS CONSOLE RUBELLA ANTIBODY, IGM: RUBELLA: IMMUNE

## 2014-07-21 DIAGNOSIS — Z8279 Family history of other congenital malformations, deformations and chromosomal abnormalities: Secondary | ICD-10-CM | POA: Insufficient documentation

## 2014-10-16 ENCOUNTER — Encounter: Payer: Self-pay | Admitting: Family Medicine

## 2014-11-07 LAB — OB RESULTS CONSOLE GBS: GBS: POSITIVE

## 2014-11-20 ENCOUNTER — Encounter (HOSPITAL_COMMUNITY): Payer: Self-pay | Admitting: *Deleted

## 2014-11-20 ENCOUNTER — Telehealth (HOSPITAL_COMMUNITY): Payer: Self-pay | Admitting: *Deleted

## 2014-11-20 NOTE — Telephone Encounter (Signed)
Preadmission screen  

## 2014-12-02 ENCOUNTER — Encounter (HOSPITAL_COMMUNITY): Payer: Self-pay

## 2014-12-02 DIAGNOSIS — Z3483 Encounter for supervision of other normal pregnancy, third trimester: Secondary | ICD-10-CM

## 2014-12-02 HISTORY — DX: Encounter for supervision of other normal pregnancy, third trimester: Z34.83

## 2014-12-02 NOTE — H&P (Signed)
Brandy Ramirez is a 28 y.o. female G3P2002 at 39+ for IOL given term and favorable.  +FM, no LOF, no VB, occ ctx; d/w pt r/b/a of IOL as well as POC.  Relatively uncomplicated PNC.    Maternal Medical History:  Contractions: Frequency: irregular.    Fetal activity: Perceived fetal activity is normal.    Prenatal complications: no prenatal complications   OB History    Gravida Para Term Preterm AB TAB SAB Ectopic Multiple Living   3 2 2       2     G1 SVD 7# female G2 SVD 7#2 female G3 present  No abn pap No STD  Past Medical History  Diagnosis Date  . No pertinent past medical history   . Normal pregnancy in multigravida in third trimester 12/02/2014   Past Surgical History  Procedure Laterality Date  . No past surgeries    WTE  Family History: family history includes Birth defects in her daughter; Cancer in her father, maternal grandmother, and mother; Coronary artery disease in her father; Heart attack in her father and paternal grandfather; Stroke in her father. Social History:  reports that she has never smoked. She has never used smokeless tobacco. She reports that she does not drink alcohol or use illicit drugs. RN, married Meds PNV All NKDA   Prenatal Transfer Tool  Maternal Diabetes: No Genetic Screening: Normal Maternal Ultrasounds/Referrals: Normal Fetal Ultrasounds or other Referrals:  None Maternal Substance Abuse:  No Significant Maternal Medications:  None Significant Maternal Lab Results:  Lab values include: Group B Strep positive Other Comments:  None  Review of Systems  Constitutional: Negative.   HENT: Negative.   Eyes: Negative.   Respiratory: Negative.   Cardiovascular: Negative.   Gastrointestinal: Negative.   Genitourinary: Negative.   Musculoskeletal: Negative.   Skin: Negative.   Neurological: Negative.   Psychiatric/Behavioral: Negative.       Last menstrual period 03/03/2014. Maternal Exam:  Abdomen: Fundal height is appropriate  for gestation.   Estimated fetal weight is 7-8#.   Fetal presentation: vertex  Introitus: Normal vulva. Normal vagina.  Cervix: Cervix evaluated by digital exam.     Physical Exam  Constitutional: She is oriented to person, place, and time. She appears well-developed and well-nourished.  HENT:  Head: Normocephalic and atraumatic.  Cardiovascular: Normal rate and regular rhythm.   Respiratory: Effort normal and breath sounds normal. No respiratory distress. She has no wheezes.  GI: Soft. Bowel sounds are normal. She exhibits no distension. There is no tenderness.  Musculoskeletal: Normal range of motion.  Neurological: She is alert and oriented to person, place, and time.  Skin: Skin is warm and dry.  Psychiatric: She has a normal mood and affect. Her behavior is normal.    Prenatal labs: ABO, Rh: O/Positive/-- (05/26 0000) Antibody: Negative (05/26 0000) Rubella: Immune (05/26 0000) RPR: Nonreactive (05/26 0000)  HBsAg: Negative (05/26 0000)  HIV: Non-reactive (05/26 0000)  GBS: Positive (11/24 0000)   Hgb13.4/Plt 255K/Ur Cx neg/ Chl neg/ GC neg/ First Tri Scr WNL/glucola 86/CF neg  Nl anat, ant plac, gender ?  Tdap 09/12/14 Flu 10/10/14 Assessment/Plan: [id number] at 39+ for IOL given term/favorable Epidural prn GBBS + - PCN for prophylaxis Expect SVD   Bovard-Stuckert, Brandy Ramirez 12/02/2014, 4:46 PM

## 2014-12-03 ENCOUNTER — Inpatient Hospital Stay (HOSPITAL_COMMUNITY): Payer: BC Managed Care – PPO | Admitting: Anesthesiology

## 2014-12-03 ENCOUNTER — Encounter (HOSPITAL_COMMUNITY): Payer: Self-pay

## 2014-12-03 ENCOUNTER — Inpatient Hospital Stay (HOSPITAL_COMMUNITY)
Admission: RE | Admit: 2014-12-03 | Discharge: 2014-12-04 | DRG: 775 | Disposition: A | Discharge status: Home / Self Care | Payer: BC Managed Care – PPO | Source: Ambulatory Visit | Attending: Obstetrics and Gynecology | Admitting: Obstetrics and Gynecology

## 2014-12-03 VITALS — BP 135/80 | HR 67 | Temp 98.2°F | Resp 18 | Ht 63.0 in | Wt 145.0 lb

## 2014-12-03 DIAGNOSIS — O99824 Streptococcus B carrier state complicating childbirth: Principal | ICD-10-CM | POA: Diagnosis present

## 2014-12-03 DIAGNOSIS — Z3483 Encounter for supervision of other normal pregnancy, third trimester: Secondary | ICD-10-CM

## 2014-12-03 DIAGNOSIS — Z3A39 39 weeks gestation of pregnancy: Secondary | ICD-10-CM | POA: Diagnosis present

## 2014-12-03 DIAGNOSIS — Z3480 Encounter for supervision of other normal pregnancy, unspecified trimester: Secondary | ICD-10-CM

## 2014-12-03 HISTORY — DX: Encounter for supervision of other normal pregnancy, third trimester: Z34.83

## 2014-12-03 LAB — ABO/RH: ABO/RH(D): O POS

## 2014-12-03 LAB — TYPE AND SCREEN
ABO/RH(D): O POS
Antibody Screen: NEGATIVE

## 2014-12-03 LAB — CBC
HCT: 39.3 % (ref 36.0–46.0)
Hemoglobin: 13.3 g/dL (ref 12.0–15.0)
MCH: 30.7 pg (ref 26.0–34.0)
MCHC: 33.8 g/dL (ref 30.0–36.0)
MCV: 90.8 fL (ref 78.0–100.0)
PLATELETS: 225 10*3/uL (ref 150–400)
RBC: 4.33 MIL/uL (ref 3.87–5.11)
RDW: 13.1 % (ref 11.5–15.5)
WBC: 14.9 10*3/uL — ABNORMAL HIGH (ref 4.0–10.5)

## 2014-12-03 LAB — RPR

## 2014-12-03 MED ORDER — LACTATED RINGERS IV SOLN
INTRAVENOUS | Status: DC
  Administered 2014-12-03: 14:00:00 via INTRAVENOUS

## 2014-12-03 MED ORDER — OXYTOCIN BOLUS FROM INFUSION
500.0000 mL | INTRAVENOUS | Status: DC
  Administered 2014-12-03: 500 mL via INTRAVENOUS

## 2014-12-03 MED ORDER — BENZOCAINE-MENTHOL 20-0.5 % EX AERO
1.0000 "application " | INHALATION_SPRAY | CUTANEOUS | Status: DC | PRN
  Filled 2014-12-03: qty 56

## 2014-12-03 MED ORDER — ONDANSETRON HCL 4 MG PO TABS: 4.0000 mg | ORAL_TABLET | ORAL | Status: DC | PRN

## 2014-12-03 MED ORDER — ZOLPIDEM TARTRATE 5 MG PO TABS
5.0000 mg | ORAL_TABLET | Freq: Every evening | ORAL | Status: DC | PRN
Start: 2014-12-03 — End: 2014-12-04

## 2014-12-03 MED ORDER — OXYCODONE-ACETAMINOPHEN 5-325 MG PO TABS
1.0000 | ORAL_TABLET | ORAL | Status: DC | PRN
Start: 2014-12-03 — End: 2014-12-03

## 2014-12-03 MED ORDER — TERBUTALINE SULFATE 1 MG/ML IJ SOLN: 0.2500 mg | Freq: Once | INTRAMUSCULAR | Status: DC | PRN

## 2014-12-03 MED ORDER — IBUPROFEN 600 MG PO TABS
600.0000 mg | ORAL_TABLET | Freq: Four times a day (QID) | ORAL | Status: DC
  Administered 2014-12-03 – 2014-12-04 (×4): 600 mg via ORAL
  Filled 2014-12-03 (×4): qty 1

## 2014-12-03 MED ORDER — DIPHENHYDRAMINE HCL 25 MG PO CAPS: 25.0000 mg | ORAL_CAPSULE | Freq: Four times a day (QID) | ORAL | Status: DC | PRN

## 2014-12-03 MED ORDER — SIMETHICONE 80 MG PO CHEW: 80.0000 mg | CHEWABLE_TABLET | ORAL | Status: DC | PRN

## 2014-12-03 MED ORDER — PHENYLEPHRINE 40 MCG/ML (10ML) SYRINGE FOR IV PUSH (FOR BLOOD PRESSURE SUPPORT)
80.0000 ug | PREFILLED_SYRINGE | INTRAVENOUS | Status: DC | PRN
Start: 2014-12-03 — End: 2014-12-03
  Filled 2014-12-03: qty 2

## 2014-12-03 MED ORDER — OXYTOCIN 40 UNITS IN LACTATED RINGERS INFUSION - SIMPLE MED: 62.5000 mL/h | INTRAVENOUS | Status: DC

## 2014-12-03 MED ORDER — OXYCODONE-ACETAMINOPHEN 5-325 MG PO TABS: 2.0000 | ORAL_TABLET | ORAL | Status: DC | PRN

## 2014-12-03 MED ORDER — LACTATED RINGERS IV SOLN
500.0000 mL | Freq: Once | INTRAVENOUS | Status: DC
Start: 2014-12-03 — End: 2014-12-03

## 2014-12-03 MED ORDER — SENNOSIDES-DOCUSATE SODIUM 8.6-50 MG PO TABS
2.0000 | ORAL_TABLET | ORAL | Status: DC
  Administered 2014-12-03: 2 via ORAL
  Filled 2014-12-03: qty 2

## 2014-12-03 MED ORDER — LANOLIN HYDROUS EX OINT: TOPICAL_OINTMENT | CUTANEOUS | Status: DC | PRN

## 2014-12-03 MED ORDER — CITRIC ACID-SODIUM CITRATE 334-500 MG/5ML PO SOLN: 30.0000 mL | ORAL | Status: DC | PRN

## 2014-12-03 MED ORDER — OXYTOCIN 40 UNITS IN LACTATED RINGERS INFUSION - SIMPLE MED
1.0000 m[IU]/min | INTRAVENOUS | Status: DC
  Administered 2014-12-03: 2 m[IU]/min via INTRAVENOUS
  Filled 2014-12-03: qty 1000

## 2014-12-03 MED ORDER — PHENYLEPHRINE 40 MCG/ML (10ML) SYRINGE FOR IV PUSH (FOR BLOOD PRESSURE SUPPORT)
80.0000 ug | PREFILLED_SYRINGE | INTRAVENOUS | Status: DC | PRN
  Filled 2014-12-03: qty 10
  Filled 2014-12-03: qty 2

## 2014-12-03 MED ORDER — LIDOCAINE HCL (PF) 1 % IJ SOLN
30.0000 mL | INTRAMUSCULAR | Status: DC | PRN
  Filled 2014-12-03: qty 30

## 2014-12-03 MED ORDER — ACETAMINOPHEN 325 MG PO TABS: 650.0000 mg | ORAL_TABLET | ORAL | Status: DC | PRN

## 2014-12-03 MED ORDER — EPHEDRINE 5 MG/ML INJ
10.0000 mg | INTRAVENOUS | Status: DC | PRN
  Filled 2014-12-03: qty 2

## 2014-12-03 MED ORDER — WITCH HAZEL-GLYCERIN EX PADS: 1.0000 "application " | MEDICATED_PAD | CUTANEOUS | Status: DC | PRN

## 2014-12-03 MED ORDER — LIDOCAINE HCL (PF) 1 % IJ SOLN
INTRAMUSCULAR | Status: DC | PRN
  Administered 2014-12-03 (×2): 5 mL

## 2014-12-03 MED ORDER — LACTATED RINGERS IV SOLN: 500.0000 mL | INTRAVENOUS | Status: DC | PRN

## 2014-12-03 MED ORDER — FENTANYL 2.5 MCG/ML BUPIVACAINE 1/10 % EPIDURAL INFUSION (WH - ANES)
14.0000 mL/h | INTRAMUSCULAR | Status: DC | PRN
  Administered 2014-12-03: 14 mL/h via EPIDURAL
  Filled 2014-12-03: qty 125

## 2014-12-03 MED ORDER — PENICILLIN G POTASSIUM 5000000 UNITS IJ SOLR
2.5000 10*6.[IU] | INTRAVENOUS | Status: DC
  Administered 2014-12-03: 2.5 10*6.[IU] via INTRAVENOUS
  Filled 2014-12-03 (×3): qty 2.5

## 2014-12-03 MED ORDER — DIPHENHYDRAMINE HCL 50 MG/ML IJ SOLN: 12.5000 mg | INTRAMUSCULAR | Status: DC | PRN

## 2014-12-03 MED ORDER — DEXTROSE 5 % IV SOLN
5.0000 10*6.[IU] | Freq: Once | INTRAVENOUS | Status: AC
  Administered 2014-12-03: 5 10*6.[IU] via INTRAVENOUS
  Filled 2014-12-03: qty 5

## 2014-12-03 MED ORDER — DIBUCAINE 1 % RE OINT: 1.0000 "application " | TOPICAL_OINTMENT | RECTAL | Status: DC | PRN

## 2014-12-03 MED ORDER — LACTATED RINGERS IV SOLN: INTRAVENOUS | Status: DC

## 2014-12-03 MED ORDER — ONDANSETRON HCL 4 MG/2ML IJ SOLN: 4.0000 mg | INTRAMUSCULAR | Status: DC | PRN

## 2014-12-03 MED ORDER — ONDANSETRON HCL 4 MG/2ML IJ SOLN: 4.0000 mg | Freq: Four times a day (QID) | INTRAMUSCULAR | Status: DC | PRN

## 2014-12-03 MED ORDER — OXYCODONE-ACETAMINOPHEN 5-325 MG PO TABS: 1.0000 | ORAL_TABLET | ORAL | Status: DC | PRN

## 2014-12-03 MED ORDER — PRENATAL MULTIVITAMIN CH
1.0000 | ORAL_TABLET | Freq: Every day | ORAL | Status: DC
  Administered 2014-12-04: 1 via ORAL
  Filled 2014-12-03: qty 1

## 2014-12-03 NOTE — Progress Notes (Signed)
Patient ID: Brandy Ramirez, female   DOB: [birth year], 28 y.o.   MRN: [id number]  Getting comfortable with epidural  AFVSS gen NAD FHTs 130's, good var, category 1 toco q 2-63min  SVE 08/1899/0-+1  AROM for clear fluid, w/o diff/comp  Anticipate SVD

## 2014-12-03 NOTE — Anesthesia Preprocedure Evaluation (Signed)
Anesthesia Evaluation  Patient identified by MRN, date of birth, ID band Patient awake    Reviewed: Allergy & Precautions, H&P , Patient's Chart, lab work & pertinent test results  Airway Mallampati: II  TM Distance: >3 FB Neck ROM: full    Dental   Pulmonary  breath sounds clear to auscultation        Cardiovascular Rhythm:regular Rate:Normal     Neuro/Psych    GI/Hepatic   Endo/Other    Renal/GU      Musculoskeletal   Abdominal   Peds  Hematology   Anesthesia Other Findings   Reproductive/Obstetrics (+) Pregnancy                             Anesthesia Physical Anesthesia Plan  ASA: II  Anesthesia Plan: Epidural   Post-op Pain Management:    Induction:   Airway Management Planned:   Additional Equipment:   Intra-op Plan:   Post-operative Plan:   Informed Consent: I have reviewed the patients History and Physical, chart, labs and discussed the procedure including the risks, benefits and alternatives for the proposed anesthesia with the patient or authorized representative who has indicated his/her understanding and acceptance.     Plan Discussed with:   Anesthesia Plan Comments:         Anesthesia Quick Evaluation

## 2014-12-03 NOTE — Anesthesia Procedure Notes (Signed)
Epidural Patient location during procedure: OB Start time: 12/03/2014 5:55 PM  Staffing Anesthesiologist: Rudean Curt Performed by: anesthesiologist   Preanesthetic Checklist Completed: patient identified, site marked, surgical consent, pre-op evaluation, timeout performed, IV checked, risks and benefits discussed and monitors and equipment checked  Epidural Patient position: sitting Prep: site prepped and draped and DuraPrep Patient monitoring: continuous pulse ox and blood pressure Approach: midline Location: L3-L4 Injection technique: LOR air  Needle:  Needle type: Tuohy  Needle gauge: 17 G Needle length: 9 cm and 9 Needle insertion depth: 5 cm cm Catheter type: closed end flexible Catheter size: 19 Gauge Catheter at skin depth: 10 cm Test dose: negative  Assessment Events: blood not aspirated, injection not painful, no injection resistance, negative IV test and no paresthesia  Additional Notes Patient identified.  Risk benefits discussed including failed block, incomplete pain control, headache, nerve damage, paralysis, blood pressure changes, nausea, vomiting, reactions to medication both toxic or allergic, and postpartum back pain.  Patient expressed understanding and wished to proceed.  All questions were answered.  Sterile technique used throughout procedure and epidural site dressed with sterile barrier dressing. No paresthesia or other complications noted.The patient did not experience any signs of intravascular injection such as tinnitus or metallic taste in mouth nor signs of intrathecal spread such as rapid motor block. Please see nursing notes for vital signs.

## 2014-12-04 LAB — CBC
HCT: 38.7 % (ref 36.0–46.0)
HEMOGLOBIN: 13.2 g/dL (ref 12.0–15.0)
MCH: 31.1 pg (ref 26.0–34.0)
MCHC: 34.1 g/dL (ref 30.0–36.0)
MCV: 91.3 fL (ref 78.0–100.0)
Platelets: 217 10*3/uL (ref 150–400)
RBC: 4.24 MIL/uL (ref 3.87–5.11)
RDW: 13.2 % (ref 11.5–15.5)
WBC: 17.4 10*3/uL — AB (ref 4.0–10.5)

## 2014-12-04 MED ORDER — OXYCODONE-ACETAMINOPHEN 5-325 MG PO TABS: 1.0000 | ORAL_TABLET | Freq: Four times a day (QID) | ORAL | Status: DC | PRN

## 2014-12-04 MED ORDER — IBUPROFEN 800 MG PO TABS: 800.0000 mg | ORAL_TABLET | Freq: Three times a day (TID) | ORAL | Status: DC | PRN

## 2014-12-04 MED ORDER — PRENATAL MULTIVITAMIN CH: 1.0000 | ORAL_TABLET | Freq: Every day | ORAL | Status: DC

## 2014-12-04 NOTE — Lactation Note (Signed)
This note was copied from the chart of [place]. Lactation Consultation Note  Patient Name: Brandy Ramirez EFEOF'H Date: 12/04/2014 Reason for consult: Initial assessment  Per mom the baby has been sleepy since the circ and recently fed for 20 mins .  Per mom breast are heavier and nipples getting tender,but the tissue is find.  LC instructed  mom on the use comfort gels.  LC discussed sore nipple and engorgement prevention and tx.  Referring to the baby  And me booklet. LC encouraged mom to call for latch assessment.   Maternal Data Has patient been taught Hand Expression?:  (per mom fels comfortable with technique )  Feeding Feeding Type:  (per mom baby recently fed ) Length of feed: 20 min (per mom )  LATCH Score/Interventions Latch: Too sleepy or reluctant, no latch achieved, no sucking elicited. (Sleepy after being circumcized)                    Lactation Tools Discussed/Used WIC Program: No Pump Review: Milk Storage Initiated by:: MAI  Date initiated:: 12/04/14   Consult Status Consult Status: PRN    Myer Haff 12/04/2014, 2:53 PM

## 2014-12-04 NOTE — Progress Notes (Signed)
Post Partum Day 1 Subjective: no complaints, up ad lib, voiding, tolerating PO and nl lochia, pain controlled, desires early discharge  Objective: Blood pressure 123/76, pulse 68, temperature 98 F (36.7 C), temperature source Oral, resp. rate 18, height 5\' 3"  (1.6 m), weight 65.772 kg (145 lb), last menstrual period 03/03/2014, SpO2 99 %, unknown if currently breastfeeding.  Physical Exam:  General: alert and no distress Lochia: appropriate Uterine Fundus: firm   Recent Labs  12/03/14 1350 12/04/14 0550  HGB 13.3 13.2  HCT 39.3 38.7    Assessment/Plan: Discharge home, Breastfeeding and Lactation consult.  Routine care.  D/c with motrin, percocet and PNV, f/u 6 weeks   LOS: 1 day   Bovard-Stuckert, Brandy Ramirez 12/04/2014, 6:26 AM

## 2014-12-04 NOTE — Anesthesia Postprocedure Evaluation (Signed)
Anesthesia Post Note  Patient: Brandy Ramirez  Procedure(s) Performed: * No procedures listed *  Anesthesia type: Epidural  Patient location: Mother/Baby  Post pain: Pain level controlled  Post assessment: Post-op Vital signs reviewed  Last Vitals:  Filed Vitals:   12/04/14 0635  BP: 135/80  Pulse: 67  Temp: 36.8 C  Resp: 18    Post vital signs: Reviewed  Level of consciousness:alert  Complications: No apparent anesthesia complications

## 2014-12-04 NOTE — Discharge Summary (Signed)
Obstetric Discharge Summary Reason for Admission: induction of labor Prenatal Procedures: none Intrapartum Procedures: spontaneous vaginal delivery Postpartum Procedures: none Complications-Operative and Postpartum: 2nd  degree perineal laceration HEMOGLOBIN  Date Value Ref Range Status  12/04/2014 13.2 12.0 - 15.0 g/dL Final   HCT  Date Value Ref Range Status  12/04/2014 38.7 36.0 - 46.0 % Final    Physical Exam:  General: alert and no distress Lochia: appropriate Uterine Fundus: firm  Discharge Diagnoses: Term Pregnancy-delivered  Discharge Information: Date: 12/04/2014 Activity: pelvic rest Diet: routine Medications: PNV, Ibuprofen and Percocet Condition: stable Instructions: refer to practice specific booklet Discharge to: home   Newborn Data: Live born female  Birth Weight: 7 lb 2.3 oz (3240 g) APGAR: 8, 9  Home with mother.  Brandy Ramirez, Brandy Ramirez 12/04/2014, 9:43 PM

## 2016-05-09 ENCOUNTER — Ambulatory Visit (INDEPENDENT_AMBULATORY_CARE_PROVIDER_SITE_OTHER): Payer: BLUE CROSS/BLUE SHIELD | Admitting: Cardiovascular Disease

## 2016-05-09 ENCOUNTER — Encounter: Payer: Self-pay | Admitting: Cardiovascular Disease

## 2016-05-09 VITALS — BP 110/80 | HR 68 | Ht 63.0 in | Wt 120.4 lb

## 2016-05-09 DIAGNOSIS — R072 Precordial pain: Secondary | ICD-10-CM | POA: Diagnosis not present

## 2016-05-09 MED ORDER — PANTOPRAZOLE SODIUM 40 MG PO TBEC: 40.0000 mg | DELAYED_RELEASE_TABLET | Freq: Every day | ORAL | Status: DC

## 2016-05-09 NOTE — Progress Notes (Signed)
Chief Complaint  Patient presents with  . Chest Pain   History of Present Illness: 30 yo female with no chronic medical issues and no prior surgical procedures who is here today as a new patient for evaluation of chest pain and arm numbness. 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 She has had 3 normal pregnancies and uncomplicated vaginal deliveries with all three pregnancies. For the last several months she has had episodes of abdominal and chest pain. The pain often starts in her abdomen and radiates to her chest. This can occur with or without meals. The pain in her chest is sharp and radiates across her chest wall. There is no associated dyspnea, LE edema. She has occasional dizziness. There have also been several episodes of arm numbness in both arms. This lasted for several hours. She had severe pain in her epigastrium   Primary Care Physician: Rory Percy, MD   Past Medical History  Diagnosis Date  . No pertinent past medical history   . Normal pregnancy in multigravida in third trimester 12/02/2014  . SVD (spontaneous vaginal delivery) 12/03/2014    Past Surgical History  Procedure Laterality Date  . No past surgeries      Current Outpatient Prescriptions  Medication Sig Dispense Refill  . pantoprazole (PROTONIX) 40 MG tablet Take 1 tablet (40 mg total) by mouth daily. 30 tablet 11   No current facility-administered medications for this visit.    No Known Allergies  Social History   Social History  . Marital Status: Married    Spouse Name: drew  . Number of Children: 2  . Years of Education: 16   Occupational History  . 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     Social History Main Topics  . Smoking status: Never Smoker   . Smokeless tobacco: Never Used  . Alcohol Use: No  . Drug Use: No  . Sexual Activity:    Partners: Male   Other Topics Concern  . 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 Cancer Mother     stage IV MELANOMA  . Coronary artery disease Father   . Heart attack Father   . Stroke Father   . Cancer Father     non hodgkins lymphoma  . Heart attack Paternal Grandfather   . Birth defects Daughter     VSD  . Cancer Maternal Grandmother     colon    Review of Systems:  As stated in the HPI and otherwise negative.   BP 110/80 mmHg  Pulse 68  Ht 5\' 3"  (1.6 m)  Wt 120 lb 6.4 oz (54.613 kg)  BMI 21.33 kg/m2  Physical Examination: General: Well developed, well nourished, NAD HEENT: OP clear, mucus membranes moist SKIN: warm, dry. No rashes. Neuro: No focal deficits Musculoskeletal: Muscle strength 5/5 all ext Psychiatric: Mood and affect normal Neck: No JVD, no carotid bruits, no thyromegaly, no lymphadenopathy. Lungs:Clear bilaterally, no wheezes, rhonci, crackles Cardiovascular: Regular rate and rhythm. No murmurs, gallops or rubs. Abdomen:Soft. Bowel sounds present. Non-tender.  Extremities: No lower extremity edema. Pulses are 2 + in the bilateral DP/PT.  EKG:  EKG is not ordered today. The ekg ordered today demonstrates  EKG from 03/05/16 reviewed and shows sinus rhythm with rate 68 bpm  Recent Labs: No results found for requested labs within last 365 days.   Lipid Panel    Component Value Date/Time   CHOL 167 08/31/2013 0942   TRIG 75.0 08/31/2013 0942   HDL 60.30 08/31/2013 0942   CHOLHDL 3 08/31/2013 0942   VLDL 15.0 08/31/2013 0942   LDLCALC 92 08/31/2013 0942     Wt Readings from Last 3 Encounters:  05/09/16 120 lb 6.4 oz (54.613 kg)  12/03/14 145 lb (65.772 kg)  08/31/13 116 lb 4 oz (52.731 kg)     Other studies Reviewed: Additional studies/ records that were reviewed today include: . Review of the above records demonstrates:   Assessment and Plan:    1. Chest pain: Her chest pain is not typical. I do not think it is cardiac related. EKG is overall normal. Her symptoms are at rest. There has been some association with meals. I will arrange an echo to exclude structural heart disease and assess LVEF. I do not think a stress test is indicated. I will start Protonix 40 mg once daily to see if this helps her symptoms which may be related to GERD/gastritis. If there is no resolution of symptoms and echo is normal, may need GI referral.   Current medicines are reviewed at length with the patient today.  The patient does not have concerns regarding medicines.  The following changes have been made:  no change  Labs/ tests ordered today include:   Orders Placed This Encounter  Procedures  . ECHOCARDIOGRAM COMPLETE    Disposition:   FU with me as needed. Signed, Lauree Chandler, MD 05/09/2016 5:11 PM    [address] [place], [place], [place]  [address] Phone: [phone]; Fax: [phone]

## 2016-05-09 NOTE — Patient Instructions (Addendum)
Medication Instructions:  Your physician has recommended you make the following change in your medication: Start protonix 40 mg by mouth daily for 2 weeks. Continue if needed.      Labwork: none  Testing/Procedures: Your physician has requested that you have an echocardiogram. Echocardiography is a painless test that uses sound waves to create images of your heart. It provides your doctor with information about the size and shape of your heart and how well your heart's chambers and valves are working. This procedure takes approximately one hour. There are no restrictions for this procedure.    Follow-Up: Your physician recommends that you schedule a follow-up appointment as needed.    Any Other Special Instructions Will Be Listed Below (If Applicable).     If you need a refill on your cardiac medications before your next appointment, please call your pharmacy.

## 2016-05-28 ENCOUNTER — Ambulatory Visit (HOSPITAL_COMMUNITY): Payer: BLUE CROSS/BLUE SHIELD | Attending: Cardiovascular Disease

## 2016-05-28 ENCOUNTER — Other Ambulatory Visit: Payer: Self-pay

## 2016-05-28 DIAGNOSIS — R072 Precordial pain: Secondary | ICD-10-CM | POA: Insufficient documentation

## 2016-05-28 DIAGNOSIS — I340 Nonrheumatic mitral (valve) insufficiency: Secondary | ICD-10-CM | POA: Insufficient documentation

## 2016-05-28 DIAGNOSIS — I351 Nonrheumatic aortic (valve) insufficiency: Secondary | ICD-10-CM | POA: Diagnosis not present

## 2016-05-28 DIAGNOSIS — I071 Rheumatic tricuspid insufficiency: Secondary | ICD-10-CM | POA: Insufficient documentation

## 2016-05-28 DIAGNOSIS — I371 Nonrheumatic pulmonary valve insufficiency: Secondary | ICD-10-CM | POA: Insufficient documentation

## 2016-05-28 DIAGNOSIS — R079 Chest pain, unspecified: Secondary | ICD-10-CM | POA: Diagnosis present

## 2016-05-28 LAB — ECHOCARDIOGRAM COMPLETE
AOASC: 32 cm
CHL CUP DOP CALC LVOT VTI: 24.5 cm
E decel time: 208 msec
EERAT: 5.81
FS: 31 % (ref 28–44)
IVS/LV PW RATIO, ED: 1.03
LA vol A4C: 28.5 ml
LADIAMINDEX: 2.12 cm/m2
LASIZE: 33 mm
LEFT ATRIUM END SYS DIAM: 33 mm
LV e' LATERAL: 16.6 cm/s
LVEEAVG: 5.81
LVEEMED: 5.81
LVOT area: 3.14 cm2
LVOT diameter: 20 mm
LVOT peak grad rest: 6 mmHg
LVOTPV: 124 cm/s
LVOTSV: 77 mL
MV Dec: 208
MV pk A vel: 60.6 m/s
MVPG: 4 mmHg
MVPKEVEL: 96.5 m/s
PV Reg grad dias: 5 mmHg
PV Reg vel dias: 114 cm/s
PW: 7.46 mm — AB (ref 0.6–1.1)
RV LATERAL S' VELOCITY: 10.5 cm/s
TDI e' lateral: 16.6
TDI e' medial: 10.1

## 2016-08-16 DIAGNOSIS — G8929 Other chronic pain: Secondary | ICD-10-CM | POA: Insufficient documentation

## 2016-08-27 ENCOUNTER — Encounter: Payer: Self-pay | Admitting: Gastroenterology

## 2016-08-27 ENCOUNTER — Encounter: Payer: Self-pay | Admitting: Physician Assistant

## 2016-09-03 ENCOUNTER — Ambulatory Visit: Payer: BLUE CROSS/BLUE SHIELD | Admitting: Gastroenterology

## 2016-09-09 ENCOUNTER — Telehealth: Payer: Self-pay | Admitting: Physician Assistant

## 2016-09-09 ENCOUNTER — Encounter: Payer: Self-pay | Admitting: *Deleted

## 2016-09-09 ENCOUNTER — Ambulatory Visit (INDEPENDENT_AMBULATORY_CARE_PROVIDER_SITE_OTHER): Payer: BLUE CROSS/BLUE SHIELD | Admitting: Physician Assistant

## 2016-09-09 VITALS — BP 100/64 | HR 80 | Ht 63.25 in | Wt 123.3 lb

## 2016-09-09 DIAGNOSIS — R1013 Epigastric pain: Secondary | ICD-10-CM

## 2016-09-09 MED ORDER — OMEPRAZOLE 40 MG PO CPDR: 40.0000 mg | DELAYED_RELEASE_CAPSULE | Freq: Every day | ORAL | 0 refills | Status: DC

## 2016-09-09 NOTE — Patient Instructions (Addendum)
We sent a prescription for Omeprazole 40 mg to [facility].  Tale 1 tablet daily for 30 days.   We will call once Amy can look at the Ultrasound you had done at [facility].

## 2016-09-09 NOTE — Progress Notes (Signed)
Amy, Agree with initial assessment and plans. If gallbladder polyp 1 cm a larger, should have cholecystectomy. Please follow-up on the repeat ultrasound. Thanks

## 2016-09-09 NOTE — Progress Notes (Signed)
Subjective:    Patient ID: Brandy Ramirez, female    DOB: [birth year], 30 y.o.   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 Patient relates onset of her symptoms about 6 months ago and describes somewhat migratory sharp pain switch she began noticing in her chest and epigastric area. She says she had one episode with some pain in her arms and was concerned about cardiac disease. She was seen by a cardiologist and had echo and EKG done which were negative. She then underwent further workup by primary care with celiac testing H pylori and routine labs all of which she says were unremarkable. She did have an upper abdominal ultrasound done in July 2017. We have requested a copy but did not have the results of that available to review at the time of her visit. She says she was told that she had a polyp in her gallbladder. Patient says she has frequent episodes of which she describes as sharp stabbing pains in her subxiphoid and epigastric area she says she may go a week without any symptoms and then have symptoms every day for few days. He has had a couple of episodes which have "doubled her over" with pain that has lasted a couple of hours. She has not had nausea or vomiting with these episodes no fever or chills no diarrhea melena or hematochezia. She's not on any regular NSAIDs. She does not feel that any particular foods exacerbate the episodes but perhaps has more episodes after eating. She did try protonix for about 2 weeks and didn't feel this was making any difference so stopped taking it.  Family history negative for gallbladder disease, pertinent for maternal grandmother with colon cancer.  Review of Systems Pertinent positive and negative review of systems were noted in the above HPI section.  All other review of systems was otherwise negative.  Outpatient  Encounter Prescriptions as of 09/09/2016  Medication Sig  . Multiple Vitamin (MULTIVITAMIN) tablet Take 1 tablet by mouth as needed.  Marland Kitchen omeprazole (PRILOSEC) 40 MG capsule Take 1 capsule (40 mg total) by mouth daily.  . [DISCONTINUED] pantoprazole (PROTONIX) 40 MG tablet Take 1 tablet (40 mg total) by mouth daily.   No facility-administered encounter medications on file as of 09/09/2016.    No Known Allergies Patient Active Problem List   Diagnosis Date Noted  . Normal pregnancy, repeat 12/03/2014  . SVD (spontaneous vaginal delivery) 12/03/2014  . Normal pregnancy in multigravida in third trimester 12/02/2014  . HEADACHE 01/03/2011   Social History   Social History  . Marital status: Married    Spouse name: drew  . Number of children: 3  . Years of education: 36   Occupational History  . 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  [place]   Social History Main Topics  . Smoking status: Never Smoker  . Smokeless tobacco: Never Used  . Alcohol use Yes     Comment: wine 1-2 times a month  . Drug use: No  . Sexual activity: Yes    Partners: Male   Other Topics Concern  . 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 Twiggs's family history includes Birth defects in  her daughter; Colon cancer in her maternal grandmother; Coronary artery disease in her father; Heart attack in her father and paternal grandfather; Melanoma in her mother; Non-Hodgkin's lymphoma in her paternal grandmother; Transient ischemic attack in her father.      Objective:    Vitals:   09/09/16 0942  BP: 100/64  Pulse: 80    Physical Exam Well-developed young white female in no acute distress, pleasant blood pressure 100/64 pulse 80 BMI 21.6. HEENT ;nontraumatic normocephalic EOMI PERRLA sclera anicteric, Cardiovascular; regular rate and rhythm  with S1-S2 no murmur or gallop, Pulmonary ;clear bilaterally, Abdomen; soft nontender nondistended bowel sounds are active there is no palpable mass or hepatosplenomegaly bowel sounds are present, Rectal; exam not done, Ext; no clubbing cyanosis or edema skin warm and dry, Neuropsych; mood and affect appropriate      Assessment & Plan:   #87 30 -year-old white female with 6 month history of intermittent sharp, stabbing subxiphoid and epigastric pains. Etiology not clear, rule out possible biliary colic, gastropathy, peptic ulcer disease, GERD. Upper abdominal ultrasound was done in July 2017-possible gallbladder polyp have requested copy  Plan; review results of prior upper abdominal ultrasound Start omeprazole 40 mg by mouth every morning 1 month We discussed possible need for CCK HIDA scan and EGD, or contact patient with plan after her ultrasound can be reviewed.   Addendum; Upper abdominal ultrasound dated 06/24/2016 [facility] showed a polypoid structure within the gallbladder measuring 1.2 x 0.9 cm otherwise negative study. There was an addendum to this report which states that in discussion with the technologist this 1 cm polyp may actually represent a fold incompletely visualized follow-up ultrasound in 6-12 months was recommended. We will contact patient and schedule repeat upper abdominal ultrasound.    Amy S Esterwood PA-C 09/09/2016   Cc: Rory Percy, MD

## 2016-09-10 ENCOUNTER — Other Ambulatory Visit: Payer: Self-pay

## 2016-09-10 DIAGNOSIS — R935 Abnormal findings on diagnostic imaging of other abdominal regions, including retroperitoneum: Secondary | ICD-10-CM

## 2016-09-10 DIAGNOSIS — R1013 Epigastric pain: Secondary | ICD-10-CM

## 2016-09-10 NOTE — Telephone Encounter (Signed)
Patient contacted with an appointment for the abd u/s at [facility] long Radiology 09/24/16 arrive at 7:15 am fasting after midnight.

## 2016-09-10 NOTE — Telephone Encounter (Signed)
Advised the patient that we did receive the [place] from [facility].  I advised her Amy's nurse Eustaquio Maize will be calling her.  She thanked me for calling back.

## 2016-09-24 ENCOUNTER — Ambulatory Visit (HOSPITAL_COMMUNITY)
Admission: RE | Admit: 2016-09-24 | Discharge: 2016-09-24 | Disposition: A | Discharge status: Home / Self Care | Payer: BLUE CROSS/BLUE SHIELD | Source: Ambulatory Visit | Attending: Physician Assistant | Admitting: Physician Assistant

## 2016-09-24 DIAGNOSIS — R935 Abnormal findings on diagnostic imaging of other abdominal regions, including retroperitoneum: Secondary | ICD-10-CM | POA: Insufficient documentation

## 2016-09-24 DIAGNOSIS — K824 Cholesterolosis of gallbladder: Secondary | ICD-10-CM | POA: Diagnosis not present

## 2016-09-24 DIAGNOSIS — R1013 Epigastric pain: Secondary | ICD-10-CM | POA: Diagnosis present

## 2016-09-26 ENCOUNTER — Telehealth: Payer: Self-pay | Admitting: Physician Assistant

## 2016-09-26 NOTE — Telephone Encounter (Signed)
Spoke with the patient. Her u/s results have not been reviewed by her provider. She asked specifically about the gall bladder polyp. Confirmed it was seen and the size. She understands I am not interpreting her results. She will await a call back after the scan is reviewed.  She states she is concerned about colon cancer or cancer in general.

## 2016-09-30 NOTE — Telephone Encounter (Signed)
Patient is requesting [place] results. Best # [phone]

## 2016-09-30 NOTE — Telephone Encounter (Signed)
Discussed the results.

## 2016-10-01 ENCOUNTER — Other Ambulatory Visit: Payer: Self-pay

## 2016-10-01 DIAGNOSIS — R1084 Generalized abdominal pain: Secondary | ICD-10-CM

## 2016-10-01 DIAGNOSIS — R1011 Right upper quadrant pain: Secondary | ICD-10-CM

## 2016-10-01 NOTE — Telephone Encounter (Signed)
We talked about EGD and CCK HIDA scan at office visit- please order the CCK HIDA , can go ahead and schedule for EGD as well, since she is still hurting

## 2016-10-01 NOTE — Telephone Encounter (Signed)
Pt aware of Korea results. States she is still having pain under her breast area and would like HIDA scheduled. Pt also concerned because she has cramping and bloating and is at times constipated. She has not seen any blood in her stool but wants to make sure she should not be having a colon done or other testing. Please advise.

## 2016-10-16 ENCOUNTER — Ambulatory Visit (AMBULATORY_SURGERY_CENTER): Payer: Self-pay | Admitting: *Deleted

## 2016-10-16 VITALS — Ht 63.0 in | Wt 122.0 lb

## 2016-10-16 DIAGNOSIS — R1011 Right upper quadrant pain: Secondary | ICD-10-CM

## 2016-10-16 NOTE — Progress Notes (Signed)
No egg or soy allergy known to patient  No issues with past sedation with any surgeries  or procedures, no intubation problems  No diet pills per patient No home 02 use per patient  No blood thinners per patient  Pt denies issues with constipation  No A fib or A flutter   

## 2016-10-21 ENCOUNTER — Encounter: Payer: Self-pay | Admitting: Internal Medicine

## 2016-10-30 ENCOUNTER — Telehealth: Payer: Self-pay | Admitting: Internal Medicine

## 2016-10-30 NOTE — Telephone Encounter (Signed)
FYI see note below regarding EGD.

## 2016-10-31 ENCOUNTER — Telehealth: Payer: Self-pay | Admitting: Physician Assistant

## 2016-10-31 ENCOUNTER — Encounter (HOSPITAL_COMMUNITY)
Admission: RE | Admit: 2016-10-31 | Discharge: 2016-10-31 | Disposition: A | Discharge status: Home / Self Care | Payer: BLUE CROSS/BLUE SHIELD | Source: Ambulatory Visit | Attending: Physician Assistant | Admitting: Physician Assistant

## 2016-10-31 DIAGNOSIS — R1084 Generalized abdominal pain: Secondary | ICD-10-CM

## 2016-10-31 MED ORDER — TECHNETIUM TC 99M MEBROFENIN IV KIT
5.0000 | PACK | Freq: Once | INTRAVENOUS | Status: AC | PRN
  Administered 2016-10-31: 5 via INTRAVENOUS

## 2016-11-03 ENCOUNTER — Encounter: Payer: BLUE CROSS/BLUE SHIELD | Admitting: Internal Medicine

## 2016-11-03 NOTE — Telephone Encounter (Signed)
Advised of her results. 

## 2016-11-27 ENCOUNTER — Other Ambulatory Visit: Payer: Self-pay | Admitting: General Surgery

## 2016-11-27 DIAGNOSIS — R1032 Left lower quadrant pain: Secondary | ICD-10-CM

## 2016-11-28 ENCOUNTER — Ambulatory Visit
Admission: RE | Admit: 2016-11-28 | Discharge: 2016-11-28 | Disposition: A | Discharge status: Home / Self Care | Payer: BLUE CROSS/BLUE SHIELD | Source: Ambulatory Visit | Attending: General Surgery | Admitting: General Surgery

## 2016-11-28 DIAGNOSIS — R1032 Left lower quadrant pain: Secondary | ICD-10-CM

## 2016-11-28 MED ORDER — IOPAMIDOL (ISOVUE-300) INJECTION 61%: 100.0000 mL | Freq: Once | INTRAVENOUS | Status: DC | PRN

## 2016-12-01 ENCOUNTER — Other Ambulatory Visit: Payer: Self-pay | Admitting: General Surgery

## 2016-12-18 ENCOUNTER — Other Ambulatory Visit: Payer: Self-pay | Admitting: General Surgery

## 2017-02-12 ENCOUNTER — Ambulatory Visit (HOSPITAL_BASED_OUTPATIENT_CLINIC_OR_DEPARTMENT_OTHER)
Admission: RE | Admit: 2017-02-12 | Payer: BLUE CROSS/BLUE SHIELD | Source: Ambulatory Visit | Admitting: General Surgery

## 2017-02-12 ENCOUNTER — Encounter (HOSPITAL_BASED_OUTPATIENT_CLINIC_OR_DEPARTMENT_OTHER): Admission: RE | Payer: Self-pay | Source: Ambulatory Visit

## 2017-02-12 SURGERY — LAPAROSCOPIC CHOLECYSTECTOMY
Anesthesia: General

## 2017-10-09 ENCOUNTER — Other Ambulatory Visit: Payer: Self-pay | Admitting: General Surgery

## 2017-10-09 DIAGNOSIS — K824 Cholesterolosis of gallbladder: Secondary | ICD-10-CM

## 2017-10-19 ENCOUNTER — Ambulatory Visit
Admission: RE | Admit: 2017-10-19 | Discharge: 2017-10-19 | Disposition: A | Discharge status: Home / Self Care | Payer: BLUE CROSS/BLUE SHIELD | Source: Ambulatory Visit | Attending: General Surgery | Admitting: General Surgery

## 2017-10-19 DIAGNOSIS — K824 Cholesterolosis of gallbladder: Secondary | ICD-10-CM

## 2017-12-17 ENCOUNTER — Other Ambulatory Visit: Payer: Self-pay | Admitting: General Surgery

## 2018-01-20 NOTE — Pre-Procedure Instructions (Signed)
Brandy Ramirez  01/20/2018      [id number] [place], [place] - [address] [place] [address] Phone: [phone] Fax: [phone]    Your procedure is scheduled on February 14  Report to [facility] Admitting at 0800 A.M.  Call this number if you have problems the morning of surgery:  [phone]   Remember:  [place]   Do not eat food  after midnight.  Continue all medications as directed by your physician except follow these medication instructions before surgery below  Please complete your PRE-SURGERY ENSURE that was given to before you leave your house the morning of surgery.  Please, if able, drink it in one setting. DO NOT SIP.    Take these medicines the morning of surgery with A SIP OF WATER NONE  7 days prior to surgery STOP taking any Aspirin(unless otherwise instructed by your surgeon), Aleve, Naproxen, Ibuprofen, Motrin, Advil, Goody's, BC's, all herbal medications, fish oil, and all vitamins    Do not wear jewelry, make-up or nail polish.  Do not wear lotions, powders, or perfumes, or deodorant.  Do not shave 48 hours prior to surgery.  Men may shave face and neck.  Do not bring valuables to the hospital.  [facility] is not responsible for any belongings or valuables.  Contacts, dentures or bridgework may not be worn into surgery.  Leave your suitcase in the car.  After surgery it may be brought to your room.  For patients admitted to the hospital, discharge time will be determined by your treatment team.  Patients discharged the day of surgery will not be allowed to drive home.    Special instructions:   [place]- Preparing For Surgery  Before surgery, you can play an important role. Because skin is not sterile, your skin needs to be as free of germs as possible. You can reduce the number of germs on your skin by washing  with CHG (chlorahexidine gluconate) Soap before surgery.  CHG is an antiseptic cleaner which kills germs and bonds with the skin to continue killing germs even after washing.  Please do not use if you have an allergy to CHG or antibacterial soaps. If your skin becomes reddened/irritated stop using the CHG.  Do not shave (including legs and underarms) for at least 48 hours prior to first CHG shower. It is OK to shave your face.  Please follow these instructions carefully.   1. Shower the NIGHT BEFORE SURGERY and the MORNING OF SURGERY with CHG.   2. If you chose to wash your hair, wash your hair first as usual with your normal shampoo.  3. After you shampoo, rinse your hair and body thoroughly to remove the shampoo.  4. Use CHG as you would any other liquid soap. You can apply CHG directly to the skin and wash gently with a scrungie or a clean washcloth.   5. Apply the CHG Soap to your body ONLY FROM THE NECK DOWN.  Do not use on open wounds or open sores. Avoid contact with your eyes, ears, mouth and genitals (private parts). Wash Face and genitals (private parts)  with your normal soap.  6. Wash thoroughly, paying special attention to the area where your surgery will be performed.  7. Thoroughly rinse your body with warm water from the neck down.  8. DO NOT shower/wash with your normal  soap after using and rinsing off the CHG Soap.  9. Pat yourself dry with a CLEAN TOWEL.  10. Wear CLEAN PAJAMAS to bed the night before surgery, wear comfortable clothes the morning of surgery  11. Place CLEAN SHEETS on your bed the night of your first shower and DO NOT SLEEP WITH PETS.    Day of Surgery: Do not apply any deodorants/lotions. Please wear clean clothes to the hospital/surgery center.      Please read over the following fact sheets that you were given.

## 2018-01-21 ENCOUNTER — Encounter (HOSPITAL_COMMUNITY)
Admission: RE | Admit: 2018-01-21 | Discharge: 2018-01-21 | Disposition: A | Discharge status: Home / Self Care | Payer: BLUE CROSS/BLUE SHIELD | Source: Ambulatory Visit | Attending: General Surgery | Admitting: General Surgery

## 2018-01-21 ENCOUNTER — Encounter (HOSPITAL_COMMUNITY): Payer: Self-pay | Admitting: *Deleted

## 2018-01-21 ENCOUNTER — Other Ambulatory Visit: Payer: Self-pay

## 2018-01-21 DIAGNOSIS — Z01818 Encounter for other preprocedural examination: Secondary | ICD-10-CM | POA: Diagnosis present

## 2018-01-21 DIAGNOSIS — K824 Cholesterolosis of gallbladder: Secondary | ICD-10-CM | POA: Insufficient documentation

## 2018-01-21 LAB — COMPREHENSIVE METABOLIC PANEL
ALBUMIN: 4.3 g/dL (ref 3.5–5.0)
ALT: 13 U/L — ABNORMAL LOW (ref 14–54)
ANION GAP: 10 (ref 5–15)
AST: 19 U/L (ref 15–41)
Alkaline Phosphatase: 58 U/L (ref 38–126)
BUN: 9 mg/dL (ref 6–20)
CO2: 25 mmol/L (ref 22–32)
Calcium: 9.2 mg/dL (ref 8.9–10.3)
Chloride: 102 mmol/L (ref 101–111)
Creatinine, Ser: 0.74 mg/dL (ref 0.44–1.00)
GFR calc non Af Amer: >60 mL/min (ref >60)
GLUCOSE: 90 mg/dL (ref 65–99)
POTASSIUM: 4.2 mmol/L (ref 3.5–5.1)
SODIUM: 137 mmol/L (ref 135–145)
Total Bilirubin: 0.5 mg/dL (ref 0.3–1.2)
Total Protein: 7 g/dL (ref 6.5–8.1)

## 2018-01-21 LAB — HEMOGLOBIN: Hemoglobin: 13.7 g/dL (ref 12.0–15.0)

## 2018-01-21 NOTE — Progress Notes (Addendum)
PCP - Vicenta Aly Cardiologist - denies  Chest x-ray - not needed EKG - not needed Stress Test - denies ECHO - 2017  Cardiac Cath - denies   Anesthesia review: NO  Patient denies shortness of breath, fever, cough and chest pain at PAT appointment   Patient verbalized understanding of instructions that were given to them at the PAT appointment. Patient was also instructed that they will need to review over the PAT instructions again at home before surgery.

## 2018-01-26 MED ORDER — METHYLENE BLUE 0.5 % INJ SOLN
INTRAVENOUS | Status: AC
  Filled 2018-01-26: qty 10

## 2018-02-19 NOTE — Pre-Procedure Instructions (Signed)
Brandy Ramirez  02/19/2018      [id number] [place], [place] - [address] [place] [address] Phone: [phone] Fax: [phone]    Your procedure is scheduled on Wednesday March 20.  Report to [facility] Admitting at 6:30 A.M.  Call this number if you have problems the morning of surgery:  [phone]   Remember:  Do not eat food or drink liquids after midnight.  Take these medicines the morning of surgery with A SIP OF WATER : NONE  **Drink Ensure pre-surgery drink prior to leaving home the morning of surgery.**    7 days prior to surgery STOP taking any Aspirin(unless otherwise instructed by your surgeon), Aleve, Naproxen, Ibuprofen, Motrin, Advil, Goody's, BC's, all herbal medications, fish oil, and all vitamins  Please come to the hospital the morning of surgery ready to provide a urine sample.   Do not wear jewelry, make-up or nail polish.  Do not wear lotions, powders, or perfumes, or deodorant.  Do not shave 48 hours prior to surgery.  Men may shave face and neck.  Do not bring valuables to the hospital.  [facility] is not responsible for any belongings or valuables.  Contacts, dentures or bridgework may not be worn into surgery.  Leave your suitcase in the car.  After surgery it may be brought to your room.  For patients admitted to the hospital, discharge time will be determined by your treatment team.  Patients discharged the day of surgery will not be allowed to drive home.   Special instructions:    [place]- Preparing For Surgery  Before surgery, you can play an important role. Because skin is not sterile, your skin needs to be as free of germs as possible. You can reduce the number of germs on your skin by washing with CHG (chlorahexidine gluconate) Soap before surgery.  CHG is an antiseptic cleaner which kills germs and bonds with the skin to continue killing  germs even after washing.  Please do not use if you have an allergy to CHG or antibacterial soaps. If your skin becomes reddened/irritated stop using the CHG.  Do not shave (including legs and underarms) for at least 48 hours prior to first CHG shower. It is OK to shave your face.  Please follow these instructions carefully.   1. Shower the NIGHT BEFORE SURGERY and the MORNING OF SURGERY with CHG.   2. If you chose to wash your hair, wash your hair first as usual with your normal shampoo.  3. After you shampoo, rinse your hair and body thoroughly to remove the shampoo.  4. Use CHG as you would any other liquid soap. You can apply CHG directly to the skin and wash gently with a scrungie or a clean washcloth.   5. Apply the CHG Soap to your body ONLY FROM THE NECK DOWN.  Do not use on open wounds or open sores. Avoid contact with your eyes, ears, mouth and genitals (private parts). Wash Face and genitals (private parts)  with your normal soap.  6. Wash thoroughly, paying special attention to the area where your surgery will be performed.  7. Thoroughly rinse your body with warm water from the neck down.  8. DO NOT shower/wash with your normal soap after using and rinsing off the CHG Soap.  9. Pat yourself dry with a CLEAN TOWEL.  10. Wear CLEAN PAJAMAS to bed  the night before surgery, wear comfortable clothes the morning of surgery  11. Place CLEAN SHEETS on your bed the night of your first shower and DO NOT SLEEP WITH PETS.    Day of Surgery: Do not apply any deodorants/lotions. Please wear clean clothes to the hospital/surgery center.      Please read over the following fact sheets that you were given. Coughing and Deep Breathing and Surgical Site Infection Prevention

## 2018-02-22 ENCOUNTER — Other Ambulatory Visit: Payer: Self-pay

## 2018-02-22 ENCOUNTER — Encounter (HOSPITAL_COMMUNITY)
Admission: RE | Admit: 2018-02-22 | Discharge: 2018-02-22 | Disposition: A | Discharge status: Home / Self Care | Payer: BLUE CROSS/BLUE SHIELD | Source: Ambulatory Visit | Attending: General Surgery | Admitting: General Surgery

## 2018-02-22 ENCOUNTER — Encounter (HOSPITAL_COMMUNITY): Payer: Self-pay

## 2018-02-22 DIAGNOSIS — Z01812 Encounter for preprocedural laboratory examination: Secondary | ICD-10-CM | POA: Diagnosis present

## 2018-02-22 LAB — CBC
HCT: 40.3 % (ref 36.0–46.0)
Hemoglobin: 13.2 g/dL (ref 12.0–15.0)
MCH: 29.7 pg (ref 26.0–34.0)
MCHC: 32.8 g/dL (ref 30.0–36.0)
MCV: 90.8 fL (ref 78.0–100.0)
PLATELETS: 266 10*3/uL (ref 150–400)
RBC: 4.44 MIL/uL (ref 3.87–5.11)
RDW: 11.8 % (ref 11.5–15.5)
WBC: 5.9 10*3/uL (ref 4.0–10.5)

## 2018-02-22 NOTE — Progress Notes (Signed)
PCP - Vicenta Aly Cardiologist - none  ECHO -  05/28/16  Anesthesia review: NO  Patient denies shortness of breath, fever, cough and chest pain at PAT appointment   Patient verbalized understanding of instructions that were given to them at the PAT appointment. Patient was also instructed that they will need to review over the PAT instructions again at home before surgery.

## 2018-03-02 NOTE — Anesthesia Preprocedure Evaluation (Addendum)
Anesthesia Evaluation  Patient identified by MRN, date of birth, ID band Patient awake    Reviewed: Allergy & Precautions, H&P , NPO status , Patient's Chart, lab work & pertinent test results  Airway Mallampati: I  TM Distance: >3 FB Neck ROM: Full    Dental no notable dental hx. (+) Teeth Intact, Dental Advisory Given   Pulmonary neg pulmonary ROS,    Pulmonary exam normal breath sounds clear to auscultation       Cardiovascular Exercise Tolerance: Good negative cardio ROS   Rhythm:Regular Rate:Normal     Neuro/Psych  Headaches, negative psych ROS   GI/Hepatic negative GI ROS, Neg liver ROS,   Endo/Other  negative endocrine ROS  Renal/GU negative Renal ROS  negative genitourinary   Musculoskeletal   Abdominal   Peds  Hematology negative hematology ROS (+)   Anesthesia Other Findings   Reproductive/Obstetrics negative OB ROS                           Anesthesia Physical Anesthesia Plan  ASA: I  Anesthesia Plan: General   Post-op Pain Management:    Induction: Intravenous  PONV Risk Score and Plan: 4 or greater and Ondansetron, Dexamethasone, Midazolam and Scopolamine patch - Pre-op  Airway Management Planned: Oral ETT  Additional Equipment:   Intra-op Plan:   Post-operative Plan: Extubation in OR  Informed Consent: I have reviewed the patients History and Physical, chart, labs and discussed the procedure including the risks, benefits and alternatives for the proposed anesthesia with the patient or authorized representative who has indicated his/her understanding and acceptance.   Dental advisory given  Plan Discussed with: CRNA  Anesthesia Plan Comments:        Anesthesia Quick Evaluation

## 2018-03-03 ENCOUNTER — Ambulatory Visit (HOSPITAL_COMMUNITY)
Admission: RE | Admit: 2018-03-03 | Discharge: 2018-03-03 | Disposition: A | Discharge status: Home / Self Care | Payer: BLUE CROSS/BLUE SHIELD | Source: Ambulatory Visit | Attending: General Surgery | Admitting: General Surgery

## 2018-03-03 ENCOUNTER — Encounter (HOSPITAL_COMMUNITY)
Admission: RE | Disposition: A | Discharge status: Home / Self Care | Payer: Self-pay | Source: Ambulatory Visit | Attending: General Surgery

## 2018-03-03 ENCOUNTER — Ambulatory Visit (HOSPITAL_COMMUNITY): Payer: BLUE CROSS/BLUE SHIELD | Admitting: Certified Registered Nurse Anesthetist

## 2018-03-03 ENCOUNTER — Encounter (HOSPITAL_COMMUNITY): Payer: Self-pay | Admitting: Certified Registered Nurse Anesthetist

## 2018-03-03 DIAGNOSIS — K824 Cholesterolosis of gallbladder: Secondary | ICD-10-CM | POA: Diagnosis not present

## 2018-03-03 DIAGNOSIS — K811 Chronic cholecystitis: Secondary | ICD-10-CM | POA: Diagnosis not present

## 2018-03-03 HISTORY — PX: CHOLECYSTECTOMY: SHX55

## 2018-03-03 LAB — POCT PREGNANCY, URINE: Preg Test, Ur: NEGATIVE

## 2018-03-03 SURGERY — LAPAROSCOPIC CHOLECYSTECTOMY
Anesthesia: General

## 2018-03-03 MED ORDER — MIDAZOLAM HCL 2 MG/2ML IJ SOLN
INTRAMUSCULAR | Status: AC
  Filled 2018-03-03: qty 2

## 2018-03-03 MED ORDER — ONDANSETRON HCL 4 MG/2ML IJ SOLN
INTRAMUSCULAR | Status: AC
  Filled 2018-03-03: qty 2

## 2018-03-03 MED ORDER — FENTANYL CITRATE (PF) 250 MCG/5ML IJ SOLN
INTRAMUSCULAR | Status: AC
  Filled 2018-03-03: qty 5

## 2018-03-03 MED ORDER — LACTATED RINGERS IV SOLN
INTRAVENOUS | Status: DC | PRN
  Administered 2018-03-03 (×2): via INTRAVENOUS

## 2018-03-03 MED ORDER — PHENYLEPHRINE 40 MCG/ML (10ML) SYRINGE FOR IV PUSH (FOR BLOOD PRESSURE SUPPORT)
PREFILLED_SYRINGE | INTRAVENOUS | Status: AC
  Filled 2018-03-03: qty 10

## 2018-03-03 MED ORDER — FENTANYL CITRATE (PF) 250 MCG/5ML IJ SOLN
INTRAMUSCULAR | Status: DC | PRN
  Administered 2018-03-03: 25 ug via INTRAVENOUS
  Administered 2018-03-03 (×3): 50 ug via INTRAVENOUS
  Administered 2018-03-03: 25 ug via INTRAVENOUS
  Administered 2018-03-03: 50 ug via INTRAVENOUS

## 2018-03-03 MED ORDER — SUGAMMADEX SODIUM 200 MG/2ML IV SOLN
INTRAVENOUS | Status: DC | PRN
  Administered 2018-03-03: 120 mg via INTRAVENOUS

## 2018-03-03 MED ORDER — CHLORHEXIDINE GLUCONATE CLOTH 2 % EX PADS: 6.0000 | MEDICATED_PAD | Freq: Once | CUTANEOUS | Status: DC

## 2018-03-03 MED ORDER — ACETAMINOPHEN 650 MG RE SUPP: 650.0000 mg | RECTAL | Status: DC | PRN

## 2018-03-03 MED ORDER — MIDAZOLAM HCL 5 MG/5ML IJ SOLN
INTRAMUSCULAR | Status: DC | PRN
  Administered 2018-03-03: 2 mg via INTRAVENOUS

## 2018-03-03 MED ORDER — PROPOFOL 10 MG/ML IV BOLUS
INTRAVENOUS | Status: DC | PRN
  Administered 2018-03-03: 20 mg via INTRAVENOUS
  Administered 2018-03-03: 130 mg via INTRAVENOUS
  Administered 2018-03-03: 20 mg via INTRAVENOUS

## 2018-03-03 MED ORDER — PHENYLEPHRINE 40 MCG/ML (10ML) SYRINGE FOR IV PUSH (FOR BLOOD PRESSURE SUPPORT)
PREFILLED_SYRINGE | INTRAVENOUS | Status: DC | PRN
  Administered 2018-03-03 (×3): 40 ug via INTRAVENOUS
  Administered 2018-03-03: 80 ug via INTRAVENOUS

## 2018-03-03 MED ORDER — PROPOFOL 10 MG/ML IV BOLUS
INTRAVENOUS | Status: AC
  Filled 2018-03-03: qty 20

## 2018-03-03 MED ORDER — OXYCODONE HCL 5 MG PO TABS
5.0000 mg | ORAL_TABLET | ORAL | Status: DC | PRN
Start: 2018-03-03 — End: 2018-03-03
  Administered 2018-03-03: 5 mg via ORAL

## 2018-03-03 MED ORDER — HYDROMORPHONE HCL 1 MG/ML IJ SOLN
INTRAMUSCULAR | Status: AC
  Filled 2018-03-03: qty 1

## 2018-03-03 MED ORDER — ENSURE PRE-SURGERY PO LIQD: 592.0000 mL | Freq: Once | ORAL | Status: DC

## 2018-03-03 MED ORDER — SCOPOLAMINE 1 MG/3DAYS TD PT72
MEDICATED_PATCH | TRANSDERMAL | Status: DC | PRN
  Administered 2018-03-03: 1 via TRANSDERMAL

## 2018-03-03 MED ORDER — ACETAMINOPHEN 325 MG PO TABS
650.0000 mg | ORAL_TABLET | ORAL | Status: DC | PRN
Start: 2018-03-03 — End: 2018-03-03

## 2018-03-03 MED ORDER — CEFAZOLIN SODIUM-DEXTROSE 2-4 GM/100ML-% IV SOLN
2.0000 g | INTRAVENOUS | Status: AC
  Administered 2018-03-03: 2 g via INTRAVENOUS
  Filled 2018-03-03: qty 100

## 2018-03-03 MED ORDER — 0.9 % SODIUM CHLORIDE (POUR BTL) OPTIME
TOPICAL | Status: DC | PRN
  Administered 2018-03-03: 1000 mL

## 2018-03-03 MED ORDER — DEXAMETHASONE SODIUM PHOSPHATE 10 MG/ML IJ SOLN
INTRAMUSCULAR | Status: DC | PRN
  Administered 2018-03-03: 10 mg via INTRAVENOUS

## 2018-03-03 MED ORDER — ROCURONIUM BROMIDE 10 MG/ML (PF) SYRINGE
PREFILLED_SYRINGE | INTRAVENOUS | Status: AC
  Filled 2018-03-03: qty 5

## 2018-03-03 MED ORDER — ROCURONIUM BROMIDE 10 MG/ML (PF) SYRINGE
PREFILLED_SYRINGE | INTRAVENOUS | Status: DC | PRN
  Administered 2018-03-03: 40 mg via INTRAVENOUS

## 2018-03-03 MED ORDER — CELECOXIB 200 MG PO CAPS
400.0000 mg | ORAL_CAPSULE | ORAL | Status: AC
  Administered 2018-03-03: 400 mg via ORAL
  Filled 2018-03-03: qty 2

## 2018-03-03 MED ORDER — SUGAMMADEX SODIUM 200 MG/2ML IV SOLN
INTRAVENOUS | Status: AC
  Filled 2018-03-03: qty 2

## 2018-03-03 MED ORDER — HYDROMORPHONE HCL 1 MG/ML IJ SOLN
0.2500 mg | INTRAMUSCULAR | Status: DC | PRN
  Administered 2018-03-03: 0.5 mg via INTRAVENOUS

## 2018-03-03 MED ORDER — LIDOCAINE 2% (20 MG/ML) 5 ML SYRINGE
INTRAMUSCULAR | Status: DC | PRN
  Administered 2018-03-03: 60 mg via INTRAVENOUS

## 2018-03-03 MED ORDER — SCOPOLAMINE 1 MG/3DAYS TD PT72
MEDICATED_PATCH | TRANSDERMAL | Status: AC
  Filled 2018-03-03: qty 1

## 2018-03-03 MED ORDER — BUPIVACAINE-EPINEPHRINE 0.25% -1:200000 IJ SOLN
INTRAMUSCULAR | Status: DC | PRN
  Administered 2018-03-03: 5 mL

## 2018-03-03 MED ORDER — ACETAMINOPHEN 500 MG PO TABS
1000.0000 mg | ORAL_TABLET | ORAL | Status: AC
  Administered 2018-03-03: 1000 mg via ORAL
  Filled 2018-03-03: qty 2

## 2018-03-03 MED ORDER — OXYCODONE HCL 5 MG PO TABS
ORAL_TABLET | ORAL | Status: AC
  Administered 2018-03-03: 5 mg via ORAL
  Filled 2018-03-03: qty 1

## 2018-03-03 MED ORDER — TRAMADOL HCL 50 MG PO TABS: 100.0000 mg | ORAL_TABLET | Freq: Four times a day (QID) | ORAL | 0 refills | Status: DC | PRN

## 2018-03-03 MED ORDER — BUPIVACAINE-EPINEPHRINE (PF) 0.25% -1:200000 IJ SOLN
INTRAMUSCULAR | Status: AC
  Filled 2018-03-03: qty 30

## 2018-03-03 MED ORDER — GABAPENTIN 300 MG PO CAPS
300.0000 mg | ORAL_CAPSULE | ORAL | Status: AC
  Administered 2018-03-03: 300 mg via ORAL
  Filled 2018-03-03: qty 1

## 2018-03-03 MED ORDER — ONDANSETRON HCL 4 MG/2ML IJ SOLN
INTRAMUSCULAR | Status: DC | PRN
  Administered 2018-03-03: 4 mg via INTRAVENOUS

## 2018-03-03 MED ORDER — SODIUM CHLORIDE 0.9 % IR SOLN
Status: DC | PRN
  Administered 2018-03-03: 1000 mL

## 2018-03-03 SURGICAL SUPPLY — 37 items
APPLIER CLIP 5 13 M/L LIGAMAX5 (MISCELLANEOUS) ×3
BLADE CLIPPER SURG (BLADE) IMPLANT
CANISTER SUCT 3000ML PPV (MISCELLANEOUS) ×3 IMPLANT
CHLORAPREP W/TINT 26ML (MISCELLANEOUS) ×3 IMPLANT
CLIP APPLIE 5 13 M/L LIGAMAX5 (MISCELLANEOUS) ×1 IMPLANT
CLOSURE WOUND 1/2 X4 (GAUZE/BANDAGES/DRESSINGS) ×1
COVER SURGICAL LIGHT HANDLE (MISCELLANEOUS) ×3 IMPLANT
DERMABOND ADVANCED (GAUZE/BANDAGES/DRESSINGS) ×2
DERMABOND ADVANCED .7 DNX12 (GAUZE/BANDAGES/DRESSINGS) ×1 IMPLANT
DEVICE TROCAR PUNCTURE CLOSURE (ENDOMECHANICALS) ×3 IMPLANT
ELECT REM PT RETURN 9FT ADLT (ELECTROSURGICAL) ×3
ELECTRODE REM PT RTRN 9FT ADLT (ELECTROSURGICAL) ×1 IMPLANT
GLOVE BIO SURGEON STRL SZ7 (GLOVE) ×3 IMPLANT
GLOVE BIOGEL PI IND STRL 7.5 (GLOVE) ×1 IMPLANT
GLOVE BIOGEL PI INDICATOR 7.5 (GLOVE) ×2
GOWN STRL REUS W/ TWL LRG LVL3 (GOWN DISPOSABLE) ×3 IMPLANT
GOWN STRL REUS W/TWL LRG LVL3 (GOWN DISPOSABLE) ×6
KIT BASIN OR (CUSTOM PROCEDURE TRAY) ×3 IMPLANT
KIT ROOM TURNOVER OR (KITS) ×3 IMPLANT
NS IRRIG 1000ML POUR BTL (IV SOLUTION) ×3 IMPLANT
PAD ARMBOARD 7.5X6 YLW CONV (MISCELLANEOUS) ×3 IMPLANT
POUCH RETRIEVAL ECOSAC 10 (ENDOMECHANICALS) ×1 IMPLANT
POUCH RETRIEVAL ECOSAC 10MM (ENDOMECHANICALS) ×2
SCISSORS LAP 5X35 DISP (ENDOMECHANICALS) ×3 IMPLANT
SET IRRIG TUBING LAPAROSCOPIC (IRRIGATION / IRRIGATOR) ×3 IMPLANT
SLEEVE ENDOPATH XCEL 5M (ENDOMECHANICALS) ×6 IMPLANT
SPECIMEN JAR SMALL (MISCELLANEOUS) ×3 IMPLANT
STRIP CLOSURE SKIN 1/2X4 (GAUZE/BANDAGES/DRESSINGS) ×2 IMPLANT
SUT MNCRL AB 4-0 PS2 18 (SUTURE) ×6 IMPLANT
SUT VICRYL 0 UR6 27IN ABS (SUTURE) ×3 IMPLANT
TOWEL OR 17X24 6PK STRL BLUE (TOWEL DISPOSABLE) ×3 IMPLANT
TOWEL OR 17X26 10 PK STRL BLUE (TOWEL DISPOSABLE) ×3 IMPLANT
TRAY LAPAROSCOPIC MC (CUSTOM PROCEDURE TRAY) ×3 IMPLANT
TROCAR XCEL BLUNT TIP 100MML (ENDOMECHANICALS) ×3 IMPLANT
TROCAR XCEL NON-BLD 5MMX100MML (ENDOMECHANICALS) ×3 IMPLANT
TUBING INSUFFLATION (TUBING) ×3 IMPLANT
WATER STERILE IRR 1000ML POUR (IV SOLUTION) ×3 IMPLANT

## 2018-03-03 NOTE — Anesthesia Procedure Notes (Signed)
Procedure Name: Intubation Date/Time: 03/03/2018 8:41 AM Performed by: Harden Mo, CRNA Pre-anesthesia Checklist: Patient identified, Emergency Drugs available, Suction available and Patient being monitored Patient Re-evaluated:Patient Re-evaluated prior to induction Oxygen Delivery Method: Circle System Utilized Preoxygenation: Pre-oxygenation with 100% oxygen Induction Type: IV induction Ventilation: Mask ventilation without difficulty Laryngoscope Size: Miller and 2 Grade View: Grade I Tube type: Oral Tube size: 7.0 mm Number of attempts: 1 Airway Equipment and Method: Stylet and Oral airway Placement Confirmation: ETT inserted through vocal cords under direct vision,  positive ETCO2 and breath sounds checked- equal and bilateral Secured at: 20 cm Tube secured with: Tape Dental Injury: Teeth and Oropharynx as per pre-operative assessment

## 2018-03-03 NOTE — H&P (Signed)
Brandy Ramirez is an 32 y.o. adult.   Chief Complaint: gb polyp HPI: 78 yof who was previously referred by Nicoletta Ba. she had some abdominal pain for several month when I first saw her. it was related to eating at times. she describes a bandlike pain on both ruq/luq (sometimes just left), left chest pain and back pain (mostly lower). she has some nausea with this. not eating as much. had undergone evluation with ekg/echo and both negative. has no real cardiac risk factors either. had seen Dr Angelena Form with cardiolog also. she has been tried on 2 ppis without relief. she has undergone an [place] at [facility] for which that report states she has a polypoid structure in the gb that measures 1.2x0.9cm. she had [facility] that shows a 1.3 MM echogenic focus adherent to mucosa. no stones. no gbw thickening and nl cbd. she had a hida scan that shows an ef of 31% (normal is 33%). no other abnormality. she did have some mild reproduction of her symptoms with ensure but does not recall now. she elected to wait as she was building a house at that time. her symptoms continue on and off. we repeated her [place] due to polyp and this is a 1.4 mm polyp with no stones or sludge present. cbd normal.    Past Medical History:  Diagnosis Date  . No pertinent past medical history   . Normal pregnancy in multigravida in third trimester 12/02/2014  . SVD (spontaneous vaginal delivery) 12/03/2014    Past Surgical History:  Procedure Laterality Date  . WISDOM TOOTH EXTRACTION      Family History  Problem Relation Age of Onset  . Heart attack Paternal Grandfather   . Melanoma Mother        stage IV   . Coronary artery disease Father   . Heart attack Father   . Transient ischemic attack Father   . Birth defects Daughter        VSD-closed  . Colon cancer Maternal Grandmother   . Non-Hodgkin's lymphoma Paternal Grandmother   . Colon polyps Neg Hx   . Esophageal cancer Neg Hx   . Rectal  cancer Neg Hx   . Stomach cancer Neg Hx    Social History:  reports that  has never smoked. she has never used smokeless tobacco. She reports that she drinks alcohol. She reports that she does not use drugs.  Allergies: No Known Allergies  Medications Prior to Admission  Medication Sig Dispense Refill  . Multiple Vitamin (MULTIVITAMIN) tablet Take 1 tablet by mouth every other day.       Results for orders placed or performed during the hospital encounter of 03/03/18 (from the past 48 hour(s))  Pregnancy, urine POC     Status: None   Collection Time: 03/03/18  6:46 AM  Result Value Ref Range   Preg Test, Ur NEGATIVE NEGATIVE    Comment:        THE SENSITIVITY OF THIS METHODOLOGY IS >24 mIU/mL    No results found.  Review of Systems  Gastrointestinal: Positive for abdominal pain.  All other systems reviewed and are negative.   Blood pressure (!) 137/95, pulse 96, temperature 98.3 F (36.8 C), temperature source Oral, resp. rate 20, height 5\' 3"  (1.6 m), weight 59.8 kg (131 lb 12.8 oz), last menstrual period 02/15/2018, SpO2 100 %, currently breastfeeding. Physical Exam  Weight: 132.6 lb Height: 63in Body Surface Area: 1.62 m Body Mass Index: 23.49 kg/m  Temp.: 98.67F  Pulse: 69 (Regular)  BP: 114/78 (Sitting, Left Arm, Standard) Physical Exam  General Mental Status-Alert. Orientation-Oriented X3. Abdomen Note: soft nontender cv rrr pulm clear bilaterally  Assessment/Plan GALLBLADDER POLYP (K82.4) Story: Laparoscopic cholecystectomy I think she likely has gallbladder disease but has no stones. her hida is not very abnormal with ef but she did have some mild reproduction of her pain with ensure. I think cholecystectomy reasonable and will likely help her symptoms. she is agreeable. I discussed the procedure in detail. We discussed the risks and benefits of a laparoscopic cholecystectomy and possible cholangiogram including, but not limited to bleeding,  infection, injury to surrounding structures such as the intestine or liver, bile leak, retained gallstones, need to convert to an open procedure, prolonged diarrhea, blood clots such as DVT, common bile duct injury, anesthesia risks, and possible need for additional procedures. The likelihood of improvement in symptoms and return to the patient's normal status is good. We discussed the typical post-operative recovery course.   Rolm Bookbinder, MD 03/03/2018, 8:06 AM

## 2018-03-03 NOTE — Op Note (Signed)
Preoperative diagnosis: gallbladder polyp, biliary colic Postoperative diagnosis:chronic cholecystitis Procedure: Laparoscopic cholecystectomy Surgeon: Dr. Serita Grammes Anesthesia: Gen. Specimens: gb to pathology Estimated blood loss: minimal Complications: None Drains: none Sponge count was correct at completion Disposition to recovery stable  Indications: This is a68 yo healthy female with ruq pain and an [place] with a likely polyp. I  think she has biliary symptoms. I discussed a laparoscopic cholecystectomy with her.   Procedure: After informed consent was obtained the patient was taken to the operating room. She was givenantibiotics. SCDs were in place. She was placed undergeneral anesthesia without complication. Her abdomen was prepped and draped in the standard sterile surgical fashion. A surgical timeout was then performed.  I infiltrated marcainebelow the umbilicus.Imade a vertical incision. I incised the fascia and entered the peritoneum bluntly. I placed a pursestring suture.I inserted a hasson trocar and insufflated to 15 mm Hg pressure.  I then placed a 5 mm trocar in theepigastrium.Two5 mm trocars were placed in the right side of the abdomen.The gallbladder had evidence of chronic cholecystitis.Igrasped the gallbladder and retracted cephalad and lateral.EventuallyI was able to identify the critical view of safety.I then clipped the duct and divided it. The duct was viable. The clips traversed the duct. I then clipped and divided the cystic artery. I then removed the gallbladder from the liver bed. I placed the gallbladderin a bag and removed from the umbilicus. I obtained hemostasis.I then removed the hasson trocar. I tied the pursestring down and placed an additional 2-0 vicryl with the endoclose device.I then removed the remaining trocars and these were closed with 4-0 Monocryl and glue. She tolerated this well be transferred to the recovery  room.

## 2018-03-03 NOTE — Transfer of Care (Signed)
Immediate Anesthesia Transfer of Care Note  Patient: Brandy Ramirez  Procedure(s) Performed: LAPAROSCOPIC CHOLECYSTECTOMY (N/A )  Patient Location: PACU  Anesthesia Type:General  Level of Consciousness: awake and alert   Airway & Oxygen Therapy: Patient Spontanous Breathing  Post-op Assessment: Report given to RN, Post -op Vital signs reviewed and stable and Patient moving all extremities X 4  Post vital signs: Reviewed and stable  Last Vitals:  Vitals:   03/03/18 0658 03/03/18 0950  BP: (!) 137/95 (!) 129/93  Pulse: 96 100  Resp: 20 20  Temp: 36.8 C 36.6 C  SpO2: 100% 97%    Last Pain:  Vitals:   03/03/18 0658  TempSrc: Oral      Patients Stated Pain Goal: 8 (21/11/73 5670)  Complications: No apparent anesthesia complications

## 2018-03-03 NOTE — Interval H&P Note (Signed)
History and Physical Interval Note:  03/03/2018 8:08 AM  Brandy Ramirez  has presented today for surgery, with the diagnosis of GALLBLADDER POLYP  The various methods of treatment have been discussed with the patient and family. After consideration of risks, benefits and other options for treatment, the patient has consented to  Procedure(s): LAPAROSCOPIC CHOLECYSTECTOMY (N/A) as a surgical intervention .  The patient's history has been reviewed, patient examined, no change in status, stable for surgery.  I have reviewed the patient's chart and labs.  Questions were answered to the patient's satisfaction.     Rolm Bookbinder

## 2018-03-03 NOTE — Anesthesia Postprocedure Evaluation (Signed)
Anesthesia Post Note  Patient: DEJAI SCHUBACH  Procedure(s) Performed: LAPAROSCOPIC CHOLECYSTECTOMY (N/A )     Patient location during evaluation: PACU Anesthesia Type: General Level of consciousness: awake and alert Pain management: pain level controlled Vital Signs Assessment: post-procedure vital signs reviewed and stable Respiratory status: spontaneous breathing, nonlabored ventilation and respiratory function stable Cardiovascular status: blood pressure returned to baseline and stable Postop Assessment: no apparent nausea or vomiting Anesthetic complications: no    Last Vitals:  Vitals:   03/03/18 1100 03/03/18 1115  BP: 117/79 122/82  Pulse: 66 60  Resp:    Temp:    SpO2: 96% 97%    Last Pain:  Vitals:   03/03/18 1055  TempSrc:   PainSc: Asleep                 Annabell Oconnor,W. EDMOND

## 2018-03-03 NOTE — Discharge Instructions (Signed)
[facility] LAPAROSCOPIC SURGERY: POST OP INSTRUCTIONS  Always review your discharge instruction sheet given to you by the facility where your surgery was performed. IF YOU HAVE DISABILITY OR FAMILY LEAVE FORMS, YOU MUST BRING THEM TO THE OFFICE FOR PROCESSING.   DO NOT GIVE THEM TO YOUR DOCTOR.  1. A prescription for pain medication may be given to you upon discharge.  Take your pain medication as prescribed, if needed.  If narcotic pain medicine is not needed, then you may take acetaminophen (Tylenol), naprosyn (Alleve), or ibuprofen (Advil) as needed. 2. Take your usually prescribed medications unless otherwise directed. 3. If you need a refill on your pain medication, please contact your pharmacy.  They will contact our office to request authorization. Prescriptions will not be filled after 5pm or on week-ends. 4. You should follow a light diet the first few days after arrival home, such as soup and crackers, etc.  Be sure to include lots of fluids daily. 5. Most patients will experience some swelling and bruising in the area of the incisions.  Ice packs will help.  Swelling and bruising can take several days to resolve.  6. It is common to experience some constipation if taking pain medication after surgery.  Increasing fluid intake and taking a stool softener (such as Colace) will usually help or prevent this problem from occurring.  A mild laxative (Milk of Magnesia or Miralax) should be taken according to package instructions if there are no bowel movements after 48 hours. 7. Unless discharge instructions indicate otherwise, you may remove your bandages 48 hours after surgery, and you may shower at that time.  You may have steri-strips (small skin tapes) in place directly over the incision.  These strips should be left on the skin for 7-10 days.  If your surgeon used skin glue on the incision, you may shower in 24 hours.  The glue will flake  off over the next 2-3 weeks.  Any sutures or staples will be removed at the office during your follow-up visit. 8. ACTIVITIES:  You may resume regular (light) daily activities beginning the next day--such as daily self-care, walking, climbing stairs--gradually increasing activities as tolerated.  You may have sexual intercourse when it is comfortable.  Refrain from any heavy lifting or straining until approved by your doctor. a. You may drive when you are no longer taking prescription pain medication, you can comfortably wear a seatbelt, and you can safely maneuver your car and apply brakes. b. RETURN TO WORK:  __________________________________________________________ 9. You should see your doctor in the office for a follow-up appointment approximately 2-3 weeks after your surgery.  Make sure that you call for this appointment within a day or two after you arrive home to insure a convenient appointment time. 10. OTHER INSTRUCTIONS: __________________________________________________________________________________________________________________________ __________________________________________________________________________________________________________________________ WHEN TO CALL YOUR DOCTOR: 1. Fever over 101.0 2. Inability to urinate 3. Continued bleeding from incision. 4. Increased pain, redness, or drainage from the incision. 5. Increasing abdominal pain  The clinic staff is available to answer your questions during regular business hours.  Please don't hesitate to call and ask to speak to one of the nurses for clinical concerns.  If you have a medical emergency, go to the nearest emergency room or call 911.  A surgeon from [facility] is always on call at the hospital. [address], [address], [place], [place]  [address] ? [address] [address], , [place]   [address] [phone] ? [phone] ? [phone]   [phone] Web site: [email or web address]  

## 2018-03-04 ENCOUNTER — Encounter (HOSPITAL_COMMUNITY): Payer: Self-pay | Admitting: General Surgery

## 2019-10-15 ENCOUNTER — Encounter (HOSPITAL_COMMUNITY): Payer: Self-pay | Admitting: *Deleted

## 2019-10-15 ENCOUNTER — Other Ambulatory Visit: Payer: Self-pay

## 2019-10-15 ENCOUNTER — Emergency Department (HOSPITAL_COMMUNITY)
Admission: EM | Admit: 2019-10-15 | Discharge: 2019-10-15 | Disposition: A | Discharge status: Home / Self Care | Payer: BC Managed Care – PPO | Attending: Emergency Medicine | Admitting: Emergency Medicine

## 2019-10-15 DIAGNOSIS — R7989 Other specified abnormal findings of blood chemistry: Secondary | ICD-10-CM | POA: Insufficient documentation

## 2019-10-15 DIAGNOSIS — R799 Abnormal finding of blood chemistry, unspecified: Secondary | ICD-10-CM

## 2019-10-15 LAB — BASIC METABOLIC PANEL
Anion gap: 8 (ref 5–15)
BUN: 11 mg/dL (ref 6–20)
CO2: 26 mmol/L (ref 22–32)
Calcium: 9.4 mg/dL (ref 8.9–10.3)
Chloride: 104 mmol/L (ref 98–111)
Creatinine, Ser: 0.72 mg/dL (ref 0.44–1.00)
GFR calc Af Amer: >60 mL/min (ref >60)
GFR calc non Af Amer: >60 mL/min (ref >60)
Glucose, Bld: 90 mg/dL (ref 70–99)
Potassium: 4 mmol/L (ref 3.5–5.1)
Sodium: 138 mmol/L (ref 135–145)

## 2019-10-15 NOTE — ED Notes (Signed)
Sent here from [facility] after having labs drawn 2 days ago   Was called today requesting pt to go to ED for repeat K as it was reported that her K drawn at [facility] was 7.7  Lab resulted

## 2019-10-15 NOTE — ED Triage Notes (Addendum)
Pt reports she was called by her PCP today because her potassium level came back showing 7.0 today on her blood work. Pt reports she had no other abnormalities on her blood work that was drawn 2 days ago. Pt reports she does has tingling in the bottom of her feet but she has been associating that with her Vitamin [id number] but isn't certain that is what is causing it. Denies any medical hx.

## 2019-10-15 NOTE — ED Provider Notes (Signed)
[facility] EMERGENCY DEPARTMENT Provider Note   CSN: [id number] Arrival date & time: 10/15/19  1124     History   Chief Complaint Chief Complaint  Patient presents with  . Abnormal Lab    HPI Brandy Ramirez is a 33 y.o. adult presents today for recheck of potassium level.  Patient reports that on Thursday, 10/13/2019 she was having routine blood work performed at her primary care provider's office, she was contacted informing her that she had a potassium level of 7.7 on her BMP otherwise normal.  Patient presents today for recheck of potassium level.  She reports that she is feeling well today and at her baseline she denies any concerns.  She denies any chronic medical problems or history of hyperkalemia in the past.  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  Patient does mention to triage nurse about bilateral feet tingling.  I discussed this with the patient she reports this has been an ongoing issue for her for the past 1 year and she starting to supplement with B12 vitamins, she is following up with her primary care provider for this bilateral lower extremity tingling and that this does not concern her today and she does not want any further work-up or evaluation of it.  No history of fever/chills, headache, vision change, chest pain/shortness of breath, abdominal pain, nausea/vomiting, anuria or any additional concerns.    HPI  Past Medical History:  Diagnosis Date  . No pertinent past medical history   . Normal pregnancy in multigravida in third trimester 12/02/2014  . SVD (spontaneous vaginal delivery) 12/03/2014    Patient Active Problem List   Diagnosis Date Noted  . Normal pregnancy, repeat 12/03/2014  . SVD (spontaneous vaginal delivery) 12/03/2014  . Normal pregnancy in multigravida in third trimester 12/02/2014  . HEADACHE 01/03/2011    Past Surgical History:  Procedure Laterality Date  .  CHOLECYSTECTOMY N/A 03/03/2018   Procedure: LAPAROSCOPIC CHOLECYSTECTOMY;  Surgeon: Rolm Bookbinder, MD;  Location: [facility];  Service: General;  Laterality: N/A;  . WISDOM TOOTH EXTRACTION       OB History    Gravida  3   Para  3   Term  3   Preterm      AB      Living  3     SAB      TAB      Ectopic      Multiple  0   Live Births  3            Home Medications    Prior to Admission medications   Medication Sig Start Date End Date Taking? Authorizing Provider  Multiple Vitamin (MULTIVITAMIN) tablet Take 1 tablet by mouth every other day.     [provider]  traMADol (ULTRAM) 50 MG tablet Take 2 tablets (100 mg total) by mouth every 6 (six) hours as needed. 03/03/18   Rolm Bookbinder, MD    Family History Family History  Problem Relation Age of Onset  . Heart attack Paternal Grandfather   . Melanoma Mother        stage IV   . Coronary artery disease Father   . Heart attack Father   . Transient ischemic attack Father   . Birth defects Daughter        VSD-closed  . Colon cancer Maternal Grandmother   . Non-Hodgkin's lymphoma Paternal Grandmother   . Colon polyps Neg Hx   .  Esophageal cancer Neg Hx   . Rectal cancer Neg Hx   . Stomach cancer Neg Hx     Social History Social History   Tobacco Use  . Smoking status: Never Smoker  . Smokeless tobacco: Never Used  Substance Use Topics  . Alcohol use: Yes    Comment: wine 1-2 times a month  . Drug use: No     Allergies   Patient has no known allergies.   Review of Systems Review of Systems Ten systems are reviewed and are negative for acute change except as noted in the HPI   Physical Exam Updated Vital Signs BP (!) 146/89 (BP Location: Right Arm)   Pulse 65   Temp 98.2 F (36.8 C) (Oral)   Resp 16   Ht 5\' 3"  (1.6 m)   Wt 52.6 kg   LMP 09/15/2019   SpO2 100%   Breastfeeding No   BMI 20.55 kg/m   Physical Exam Constitutional:      General: He is not in acute  distress.    Appearance: Normal appearance. He is well-developed. He is not ill-appearing or diaphoretic.  HENT:     Head: Normocephalic and atraumatic.     Right Ear: External ear normal.     Left Ear: External ear normal.     Nose: Nose normal.  Eyes:     General: Vision grossly intact. Gaze aligned appropriately.     Pupils: Pupils are equal, round, and reactive to light.  Neck:     Musculoskeletal: Normal range of motion.     Trachea: Trachea and phonation normal. No tracheal deviation.  Pulmonary:     Effort: Pulmonary effort is normal. No respiratory distress.  Musculoskeletal: Normal range of motion.  Skin:    General: Skin is warm and dry.  Neurological:     Mental Status: He is alert.     GCS: GCS eye subscore is 4. GCS verbal subscore is 5. GCS motor subscore is 6.     Comments: Speech is clear and goal oriented, follows commands Major Cranial nerves without deficit, no facial droop Moves extremities without ataxia, coordination intact  Psychiatric:        Behavior: Behavior normal.    ED Treatments / Results  Labs (all labs ordered are listed, but only abnormal results are displayed) Labs Reviewed  BASIC METABOLIC PANEL    EKG None  Radiology No results found.  Procedures Procedures (including critical care time)  Medications Ordered in ED Medications - No data to display   Initial Impression / Assessment and Plan / ED Course  I have reviewed the triage vital signs and the nursing notes.  Pertinent labs & imaging results that were available during my care of the patient were reviewed by me and considered in my medical decision making (see chart for details).    BMP within normal limits, potassium 4.0 - On assessment patient is very well-appearing and in no acute distress.  States that she feels well and has no complaints.  Patient informed of BMP results today and states understanding.  She denies any symptoms and reports that she was only going to  her PCP for routine/yearly blood work and did not believe the results from Thursday.  She does mention upon questioning triage note bilateral feet tingling sensation, I discussed potential for work-up here in the ED with the patient and she refuses and states she is already working this up with her primary care provider and believes it is a B12  problem, I encouraged her to continue following up with her primary care provider and to return to the ER for any new or worsening symptoms.  At this time there does not appear to be any evidence of an acute emergency medical condition and the patient appears stable for discharge with appropriate outpatient follow up. Diagnosis was discussed with patient who verbalizes understanding of care plan and is agreeable to discharge. I have discussed return precautions with patient who verbalizes understanding of return precautions. Patient encouraged to follow-up with their PCP. All questions answered.  Patient has been discharged in good condition.  Patient's case discussed with Dr. Lacinda Axon who agrees with plan to discharge with PCP follow-up.   Note: Portions of this report may have been transcribed using voice recognition software. Every effort was made to ensure accuracy; however, inadvertent computerized transcription errors may still be present. Final Clinical Impressions(s) / ED Diagnoses   Final diagnoses:  Abnormal blood chemistry level    ED Discharge Orders    None       Gari Crown 10/15/19 1458    Nat Christen, MD 10/16/19 [phone]

## 2019-10-15 NOTE — Discharge Instructions (Addendum)
You have been diagnosed today with follow-up on abnormal blood chemistry level.  At this time there does not appear to be the presence of an emergent medical condition, however there is always the potential for conditions to change. Please read and follow the below instructions.  Please return to the Emergency Department immediately for any new or worsening symptoms. Please be sure to follow up with your Primary Care Provider within one week regarding your visit today; please call their office to schedule an appointment even if you are feeling better for a follow-up visit.  Get help right away if: You are short of breath. You have chest pain. You pass out (faint). You cannot move your muscles. You have decrease in urination. You have any new/concerning or worsening of symptoms.  Please read the additional information packets attached to your discharge summary.  Note: Portions of this text may have been transcribed using voice recognition software. Every effort was made to ensure accuracy; however, inadvertent computerized transcription errors may still be present.

## 2020-02-19 ENCOUNTER — Ambulatory Visit: Payer: Self-pay | Attending: Internal Medicine

## 2020-02-19 DIAGNOSIS — Z23 Encounter for immunization: Secondary | ICD-10-CM | POA: Insufficient documentation

## 2020-02-19 NOTE — Progress Notes (Signed)
   Covid-19 Vaccination Clinic  Name:  [name]    MRN: [id number] DOB: 1986/08/30  02/19/2020  Brandy Ramirez was observed post Covid-19 immunization for 20 minutes with incident. He was provided with Vaccine Information Sheet and instruction to access the V-Safe system.   Brandy Ramirez called RN to chairside at 1340 with complaints of flush and feeling like passing out.  RN activated response, NP at chairside.  Initial vitals BP 87/44, 58P, 100 SPO2.  Patient given oral hydration and moved to separate area via wheelchair.  EMS at chairside 1343.  Repeat vitals at 1344 BP 94/59, 55P, 100 SPO2.  Pt states at this time symptoms starting to resolve (1344).  Repeat vitals at 1348 BP 118/87, 79P, 100 SPO2.  Pt states symptoms resolved at 1350.  Repeat vitals BP 130/84, 92P, 98 SPO2.  Patient ambulated to check out area with RN.  Patient states her mother is at the car and can drive her home.  Reviewed symptoms of severe reaction.     [name] was instructed to call 911 with any severe reactions post vaccine: Marland Kitchen Difficulty breathing  . Swelling of face and throat  . A fast heartbeat  . A bad rash all over body  . Dizziness and weakness   Immunizations Administered    Name Date Dose VIS Date Route   Pfizer COVID-19 Vaccine 02/19/2020  1:34 PM 0.3 mL 11/25/2019 Intramuscular   Manufacturer: [place]   Lot: [id number]   [place]: [id number]

## 2020-03-21 ENCOUNTER — Ambulatory Visit: Payer: Self-pay | Attending: Internal Medicine

## 2020-03-21 DIAGNOSIS — Z23 Encounter for immunization: Secondary | ICD-10-CM

## 2020-03-21 NOTE — Progress Notes (Signed)
   Covid-19 Vaccination Clinic  Name:  LURANA KOERNER    MRN: [id number] DOB: [birth year]  03/21/2020  Mr. Vannest was observed post Covid-19 immunization for stayed 20 min (was supposed to be monitored for 30) without incident. He was provided with Vaccine Information Sheet and instruction to access the V-Safe system.   Mr. Chapell was instructed to call 911 with any severe reactions post vaccine: Marland Kitchen Difficulty breathing  . Swelling of face and throat  . A fast heartbeat  . A bad rash all over body  . Dizziness and weakness   Immunizations Administered    Name Date Dose VIS Date Route   Pfizer COVID-19 Vaccine 03/21/2020  8:55 AM 0.3 mL 11/25/2019 Intramuscular   Manufacturer: [place]   Lot: [id number]   [place]: [id number]

## 2020-04-10 ENCOUNTER — Ambulatory Visit (INDEPENDENT_AMBULATORY_CARE_PROVIDER_SITE_OTHER): Payer: No Typology Code available for payment source | Admitting: Dermatology

## 2020-04-10 ENCOUNTER — Other Ambulatory Visit: Payer: Self-pay

## 2020-04-10 ENCOUNTER — Encounter: Payer: Self-pay | Admitting: Dermatology

## 2020-04-10 DIAGNOSIS — L608 Other nail disorders: Secondary | ICD-10-CM

## 2020-04-13 ENCOUNTER — Encounter: Payer: Self-pay | Admitting: Dermatology

## 2020-04-13 NOTE — Progress Notes (Signed)
   Follow-Up Visit   Subjective  Brandy Ramirez is a 34 y.o. female who presents for the following: Nail Problem (Patient here today for dark spot on left great toe nail x unsure.  Patient does remember dropping a weight on her toe about 8 weeks ago.).  Dark line Location: Under left big toenail Duration:  Quality: 6+ weeks Associated Signs/Symptoms: Modifying Factors: Injury to affected nail before pigment noticed Severity:  Timing: Context:   The following portions of the chart were reviewed this encounter and updated as appropriate:     Objective  Well appearing patient in no apparent distress; mood and affect are within normal limits.  A focused examination was performed including hands, feet, toenails, fingernails. Relevant physical exam findings are noted in the Assessment and Plan.   Assessment & Plan  Melanonychia Left Hallux Toe Nail Plate  Notch nail at base of line, 69mm to prox nail fold

## 2020-06-07 ENCOUNTER — Encounter: Payer: Self-pay | Admitting: Cardiovascular Disease

## 2020-06-07 ENCOUNTER — Other Ambulatory Visit: Payer: Self-pay

## 2020-06-07 ENCOUNTER — Telehealth: Payer: Self-pay | Admitting: Radiology

## 2020-06-07 ENCOUNTER — Ambulatory Visit (INDEPENDENT_AMBULATORY_CARE_PROVIDER_SITE_OTHER): Payer: No Typology Code available for payment source | Admitting: Cardiovascular Disease

## 2020-06-07 VITALS — BP 150/90 | HR 73 | Ht 63.0 in | Wt 117.8 lb

## 2020-06-07 DIAGNOSIS — R002 Palpitations: Secondary | ICD-10-CM

## 2020-06-07 NOTE — Progress Notes (Signed)
Chief Complaint  Patient presents with  . New Patient (Initial Visit)    palpitations   History of Present Illness: 34 yo female with no chronic medical issues and no prior surgical procedures who is here today as a new patient for evaluation of palpitations. 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 She has had 3 normal pregnancies and uncomplicated vaginal deliveries with all three pregnancies. She had a cholecystectomy in 2019. I saw her in May 2017 for evaluation of chest pain. Echo June 2017 with LVEF=55-60%. No significant valve stenosis or regurgitation.   She tells me today that she was feeling well up until three months ago. She got her first Covid 19 vaccine in March 2021 and second vaccine in April 2021. Therapist, music). She had a vagal event 5 minutes after the first vaccine and did well following the second vaccine. One month after the second vaccine she began to feel her heart racing. Her pulse is never higher than 100 bpm. She feels like she cannot take a deep breath when she is feeling palpitations. She continues to have occasional resting sharp chest pains at times but these are never exertion. She is most aware of her heart racing at night but this also happens during the day. 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 She exercises regularly.   Primary Care Physician: Vicenta Aly, FNP   Past Medical History:  Diagnosis Date  . Elevated C-reactive protein (CRP)   . Myalgia   . No pertinent past medical history   . Normal pregnancy in multigravida in third trimester 12/02/2014  . SVD (spontaneous vaginal delivery) 12/03/2014  . Tachycardia     Past Surgical History:  Procedure Laterality Date  . CHOLECYSTECTOMY N/A 03/03/2018   Procedure: LAPAROSCOPIC CHOLECYSTECTOMY;  Surgeon: Rolm Bookbinder, MD;  Location: [facility];  Service: General;  Laterality: N/A;  . WISDOM TOOTH  EXTRACTION      Current Outpatient Medications  Medication Sig Dispense Refill  . Apple Cider Vinegar 600 MG CAPS Take 1 capsule by mouth daily.    . Multiple Vitamin (MULTIVITAMIN) tablet Take 1 tablet by mouth every other day.      No current facility-administered medications for this visit.    No Known Allergies  Social History   Socioeconomic History  . Marital status: Married    Spouse name: drew  . Number of children: 3  . Years of education: 40  . Highest education level: Not on file  Occupational History  . 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 Smoking status: Never Smoker  . Smokeless tobacco: Never Used  Vaping Use  . Vaping Use: Never used  Substance and Sexual Activity  . Alcohol use: Yes    Comment: wine 1-2 times a month  . Drug use: No  . Sexual activity: Yes    Partners: Male  Other Topics Concern  . 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 Difficulty of Paying Living Expenses:   Food Insecurity:   . Worried About Charity fundraiser  in the Last Year:   . [place] in the Last Year:   Transportation Needs:   . Film/video editor (Medical):   Marland Kitchen Lack of Transportation (Non-Medical):   Physical Activity:   . Days of Exercise per Week:   . Minutes of Exercise per Session:   Stress:   . Feeling of Stress :   Social Connections:   . Frequency of Communication with Friends and Family:   . Frequency of Social Gatherings with Friends and Family:   . Attends Religious Services:   . Active Member of Clubs or Organizations:   . Attends Archivist Meetings:   Marland Kitchen Marital Status:   Intimate Partner Violence:   . Fear of Current or Ex-Partner:   . Emotionally  Abused:   Marland Kitchen Physically Abused:   . Sexually Abused:     Family History  Problem Relation Age of Onset  . Heart attack Paternal Grandfather   . Melanoma Mother        stage IV   . Coronary artery disease Father   . Heart attack Father   . Transient ischemic attack Father   . Birth defects Daughter        VSD-closed  . Colon cancer Maternal Grandmother   . Non-Hodgkin's lymphoma Paternal Grandmother   . Colon polyps Neg Hx   . Esophageal cancer Neg Hx   . Rectal cancer Neg Hx   . Stomach cancer Neg Hx     Review of Systems:  As stated in the HPI and otherwise negative.   BP (!) 150/90   Pulse 73   Ht 5\' 3"  (1.6 m)   Wt 117 lb 12.8 oz (53.4 kg)   SpO2 98%   BMI 20.87 kg/m   Physical Examination: General: Well developed, well nourished, NAD  HEENT: OP clear, mucus membranes moist  SKIN: warm, dry. No rashes. Neuro: No focal deficits  Musculoskeletal: Muscle strength 5/5 all ext  Psychiatric: Mood and affect normal  Neck: No JVD, no carotid bruits, no thyromegaly, no lymphadenopathy.  Lungs:Clear bilaterally, no wheezes, rhonci, crackles Cardiovascular: Regular rate and rhythm. No murmurs, gallops or rubs. Abdomen:Soft. Bowel sounds present. Non-tender.  Extremities: No lower extremity edema. Pulses are 2 + in the bilateral DP/PT.  EKG:  EKG is ordered today. The ekg ordered today demonstrates sinus with Non-specific T wave flattening diffusely.   Recent Labs: 10/15/2019: BUN 11; Creatinine, Ser 0.72; Potassium 4.0; Sodium 138    Wt Readings from Last 3 Encounters:  06/07/20 117 lb 12.8 oz (53.4 kg)  10/15/19 116 lb (52.6 kg)  03/03/18 131 lb 12.8 oz (59.8 kg)     Other studies Reviewed: Additional studies/ records that were reviewed today include: . Review of the above records demonstrates:   Assessment and Plan:   1. Palpitations: I will arrange a 14 day Zio patch cardiac monitor and an echo. I will plan to see her back as needed. We will call her with her  testing results.   Current medicines are reviewed at length with the patient today.  The patient does not have concerns regarding medicines.  The following changes have been made:  no change  Labs/ tests ordered today include:   Orders Placed This Encounter  Procedures  . LONG TERM MONITOR (3-14 DAYS)  . EKG 12-Lead  . ECHOCARDIOGRAM COMPLETE    Disposition:   FU with me as needed. Signed, Lauree Chandler, MD 06/07/2020 10:14 AM    [address], [place], [place]  [id number] Phone: [phone]; Fax: [phone]

## 2020-06-07 NOTE — Patient Instructions (Signed)
Medication Instructions:  No changes. *If you need a refill on your cardiac medications before your next appointment, please call your pharmacy*   Lab Work: None ordered.   Testing/Procedures: Your physician has requested that you have an echocardiogram. Echocardiography is a painless test that uses sound waves to create images of your heart. It provides your doctor with information about the size and shape of your heart and how well your heart's chambers and valves are working. This procedure takes approximately one hour. There are no restrictions for this procedure.  Your physician has recommended that you wear a Zio heart monitor. Heart monitors are medical devices that record the heart's electrical activity. Doctors most often use these monitors to diagnose arrhythmias. Arrhythmias are problems with the speed or rhythm of the heartbeat. The monitor is a small, portable device. You can wear one while you do your normal daily activities. This is usually used to diagnose what is causing palpitations/syncope (passing out).  Follow-Up: Your physician recommends that you schedule a follow-up appointment as needed with Dr. Angelena Form.   Other Instructions

## 2020-06-07 NOTE — Telephone Encounter (Signed)
Enrolled patient for a 14 day Zio monitor to be mailed to patients home.  

## 2020-06-11 ENCOUNTER — Other Ambulatory Visit (INDEPENDENT_AMBULATORY_CARE_PROVIDER_SITE_OTHER): Payer: No Typology Code available for payment source

## 2020-06-11 DIAGNOSIS — R002 Palpitations: Secondary | ICD-10-CM

## 2020-06-12 ENCOUNTER — Other Ambulatory Visit (HOSPITAL_COMMUNITY): Payer: No Typology Code available for payment source

## 2020-06-12 ENCOUNTER — Telehealth: Payer: Self-pay | Admitting: *Deleted

## 2020-06-12 DIAGNOSIS — N912 Amenorrhea, unspecified: Secondary | ICD-10-CM | POA: Insufficient documentation

## 2020-06-12 NOTE — Telephone Encounter (Signed)
Patient calling with abdominal pain and sharp chest pains at times. Pain gets better with lying down but at times sharp pains from chest and into back continue. Has hx of cholecystectomy due to low EF on hida scan. Patient takes tums and gas x to help discomfort.  Patient has been scheduled to see Tye Savoy, NP for 6/30/021 at 230 pm. She verbalizes understanding.

## 2020-06-13 ENCOUNTER — Ambulatory Visit (INDEPENDENT_AMBULATORY_CARE_PROVIDER_SITE_OTHER): Payer: No Typology Code available for payment source | Admitting: Nurse Practitioner

## 2020-06-13 ENCOUNTER — Encounter: Payer: Self-pay | Admitting: Nurse Practitioner

## 2020-06-13 ENCOUNTER — Ambulatory Visit (HOSPITAL_COMMUNITY)
Admission: RE | Admit: 2020-06-13 | Discharge: 2020-06-13 | Disposition: A | Discharge status: Home / Self Care | Payer: No Typology Code available for payment source | Source: Ambulatory Visit | Attending: Cardiovascular Disease | Admitting: Cardiovascular Disease

## 2020-06-13 ENCOUNTER — Ambulatory Visit: Payer: No Typology Code available for payment source | Admitting: Gastroenterology

## 2020-06-13 ENCOUNTER — Other Ambulatory Visit: Payer: Self-pay

## 2020-06-13 VITALS — BP 120/72 | HR 60 | Ht 63.0 in | Wt 116.0 lb

## 2020-06-13 DIAGNOSIS — I77810 Thoracic aortic ectasia: Secondary | ICD-10-CM | POA: Diagnosis not present

## 2020-06-13 DIAGNOSIS — R079 Chest pain, unspecified: Secondary | ICD-10-CM

## 2020-06-13 DIAGNOSIS — R109 Unspecified abdominal pain: Secondary | ICD-10-CM

## 2020-06-13 DIAGNOSIS — G8929 Other chronic pain: Secondary | ICD-10-CM

## 2020-06-13 DIAGNOSIS — R197 Diarrhea, unspecified: Secondary | ICD-10-CM

## 2020-06-13 DIAGNOSIS — R002 Palpitations: Secondary | ICD-10-CM | POA: Diagnosis present

## 2020-06-13 MED ORDER — DICYCLOMINE HCL 10 MG PO CAPS: 10.0000 mg | ORAL_CAPSULE | Freq: Two times a day (BID) | ORAL | 2 refills | Status: DC | PRN

## 2020-06-13 MED ORDER — PANTOPRAZOLE SODIUM 40 MG PO TBEC: DELAYED_RELEASE_TABLET | ORAL | 2 refills | Status: DC

## 2020-06-13 MED ORDER — CHOLESTYRAMINE 4 G PO PACK: 4.0000 g | PACK | Freq: Every day | ORAL | 2 refills | Status: DC

## 2020-06-13 NOTE — Progress Notes (Signed)
Echocardiogram 2D Echocardiogram has been performed.  Brandy Ramirez 06/13/2020, 9:54 AM

## 2020-06-13 NOTE — Progress Notes (Signed)
.  chr

## 2020-06-13 NOTE — Progress Notes (Signed)
ASSESSMENT / PLAN:    Brandy Ramirez is a 34 y.o. female with no significant PMH.      # Chronic intermittent,  migratory abdominal pain / chest pain.  --Etiology unclear. She has chronic loose stools too but a later development following cholecystectomy.  --Stress can exacerbate symptoms. Pain may have functional component.  --Cardiac evaluation of chest pain in progress. Wearing cardiac monitor.  --Trial of Bentyl 10 mg twice daily as needed for lower abdominal pain. --For epigastric pain radiating into chest will try course of PPI.  Not clear if GERD contributing to upper symptoms.  --Follow-up with me in 4 to 6 weeks.  If no improvement will probable proceed with EGD  # Chronic loose stool --Started post cholecystectomy. --Trial of Questran 4 g daily for possible bile acid diarrhea.   --Component of IBS is also possible  HPI:     Chief Complaint:  Abdominal pain, chest pain and diarrhea.    Brandy Ramirez is a 34 yo female seen here September 2017 for evaluation of stabbing epigastric and subxiphoid pain present for 6 months at that time.  She had apparently been seen by cardiology, had an echo and EKG done which were negative  Ultrasound showed a 1.3 mm gallbladder polyp, otherwise unremarkable.  She was subsequently scheduled for a HIDA scan which was compatible with biliary dyskinesia.  It was not until March 2019 that she underwent a laparoscopic cholecystectomy with Dr. Donne Hazel.    INTERVAL HISTORY:   Brandy Ramirez is back with ongoing abdominal pain/chest pain.  She is currently undergoing evaluation by Cardiology and is wearing a monitor. She had an Echocardiogram done earlier today.  She says the pain is similar to when she was evaluated here in 2017.  Basically she  gets stabbing epigastric pain 3-4 times a week. It sometimes radiates into her chest and shoulder blades. The epigastric pain does not always radiate and sometimes she has chest pain by itself as  well.  Pain can occur in a fasting state but more often than not is related to p.o. intake.  She also feels like stress can exacerbate her symptoms. Passing gas, GasX and Tums tend to help the discomfort. A few years ago Cardiology recommended trial of Protonix but patient didn't take it as she felt at that time her symptoms were all gallbladder related. She says her symptoms did in fact go away following cholecystectomy but recurred about 6 months later. In addition to the upper abdominal pain she sometimes she gets lower abdominal pain radiating around to lower back.  Post cholecystectomy her stools have frequently been explosive and loose with urgency regardless of what or how little she eats. She typically has 1-2 postprandial bowel movements a day and having a BM helps lower abdominal / lower back pain.   Data Reviewed:  06/11/20  Care everywhere CRP <  1 CMP normal CBC normal   Past Surgical History:  Procedure Laterality Date  . CHOLECYSTECTOMY N/A 03/03/2018   Procedure: LAPAROSCOPIC CHOLECYSTECTOMY;  Surgeon: Rolm Bookbinder, MD;  Location: [facility] OR;  Service: General;  Laterality: N/A;  . WISDOM TOOTH EXTRACTION     Family History  Problem Relation Age of Onset  . Heart attack Paternal Grandfather   . Melanoma Mother        stage IV   . Coronary artery disease Father   . Heart attack Father   . Transient ischemic attack Father   .  Birth defects Daughter        VSD-closed  . Colon cancer Maternal Grandmother   . Non-Hodgkin's lymphoma Paternal Grandmother   . Colon polyps Neg Hx   . Esophageal cancer Neg Hx   . Rectal cancer Neg Hx   . Stomach cancer Neg Hx    Social History   Tobacco Use  . Smoking status: Never Smoker  . Smokeless tobacco: Never Used  Vaping Use  . Vaping Use: Never used  Substance Use Topics  . Alcohol use: Yes    Comment: wine 1-2 times a month  . Drug use: No   Current Outpatient Medications  Medication Sig Dispense Refill  . Apple Cider  Vinegar 600 MG CAPS Take 1 capsule by mouth daily.    . Multiple Vitamin (MULTIVITAMIN) tablet Take 1 tablet by mouth every other day.      No current facility-administered medications for this visit.   No Known Allergies   Review of Systems: Positive for lower back pain.  All other systems reviewed and negative except where noted in HPI.   Creatinine clearance cannot be calculated (Patient's most recent lab result is older than the maximum 21 days allowed.)   Physical Exam:    Wt Readings from Last 3 Encounters:  06/13/20 116 lb (52.6 kg)  06/07/20 117 lb 12.8 oz (53.4 kg)  10/15/19 116 lb (52.6 kg)    BP 120/72   Pulse 60   Ht 5\' 3"  (1.6 m)   Wt 116 lb (52.6 kg)   BMI 20.55 kg/m  Constitutional:  Pleasant female in no acute distress. Psychiatric: Normal mood and affect. Behavior is normal. EENT: Pupils normal.  Conjunctivae are normal. No scleral icterus. Neck supple.  Cardiovascular: Normal rate, regular rhythm. No edema Pulmonary/chest: Effort normal and breath sounds normal. No wheezing, rales or rhonchi. Abdominal: Soft, nondistended, nontender. Bowel sounds active throughout. There are no masses palpable. No hepatomegaly. Neurological: Alert and oriented to person place and time. Skin: Skin is warm and dry. No rashes noted.  Tye Savoy, NP  06/13/2020, 3:03 PM  Cc:  Referring Provider Vicenta Aly, [place]

## 2020-06-13 NOTE — Patient Instructions (Signed)
If you are age 35 or older, your body mass index should be between 23-30. Your Body mass index is 20.55 kg/m. If this is out of the aforementioned range listed, please consider follow up with your Primary Care Provider.  If you are age 42 or younger, your body mass index should be between 19-25. Your Body mass index is 20.55 kg/m. If this is out of the aformentioned range listed, please consider follow up with your Primary Care Provider.   We have sent the following medications to your pharmacy for you to pick up at your convenience:  START: questran 4mg  packet once daily.  Be sure to take separate from other medications by 2 hours.  START: Protonix 40mg  take one tablet every morning 30 minutes prior to breakfast.  START: Bentyl 10mg  take one tablet twice daily as needed for abdominal pain.   You will follow up in our office on 07-17-20 at 3:30 pm.  Thank you for entrusting me with your care and choosing [facility].  Colletta [place], NP

## 2020-06-14 ENCOUNTER — Telehealth: Payer: Self-pay | Admitting: *Deleted

## 2020-06-14 DIAGNOSIS — I77819 Aortic ectasia, unspecified site: Secondary | ICD-10-CM

## 2020-06-14 NOTE — Progress Notes (Signed)
Assessment and plan reviewed 

## 2020-06-14 NOTE — Telephone Encounter (Signed)
Message from Dr. Angelena Form received.  Order placed for ct chest aorta after possible ascending aortic dilation. Message to pre cert.   CT will call patient to arrange.

## 2020-06-15 ENCOUNTER — Other Ambulatory Visit: Payer: Self-pay

## 2020-06-15 ENCOUNTER — Other Ambulatory Visit (HOSPITAL_COMMUNITY): Payer: No Typology Code available for payment source

## 2020-06-15 ENCOUNTER — Ambulatory Visit (INDEPENDENT_AMBULATORY_CARE_PROVIDER_SITE_OTHER)
Admission: RE | Admit: 2020-06-15 | Discharge: 2020-06-15 | Disposition: A | Discharge status: Home / Self Care | Payer: No Typology Code available for payment source | Source: Ambulatory Visit | Attending: Cardiovascular Disease | Admitting: Cardiovascular Disease

## 2020-06-15 DIAGNOSIS — I77819 Aortic ectasia, unspecified site: Secondary | ICD-10-CM | POA: Diagnosis not present

## 2020-06-15 MED ORDER — IOHEXOL 350 MG/ML SOLN
100.0000 mL | Freq: Once | INTRAVENOUS | Status: AC | PRN
  Administered 2020-06-15: 100 mL via INTRAVENOUS

## 2020-06-19 ENCOUNTER — Ambulatory Visit: Payer: No Typology Code available for payment source | Admitting: Dermatology

## 2020-06-29 ENCOUNTER — Institutional Professional Consult (permissible substitution) (INDEPENDENT_AMBULATORY_CARE_PROVIDER_SITE_OTHER): Payer: No Typology Code available for payment source | Admitting: Surgery

## 2020-06-29 ENCOUNTER — Encounter: Payer: Self-pay | Admitting: Surgery

## 2020-06-29 ENCOUNTER — Other Ambulatory Visit: Payer: Self-pay

## 2020-06-29 DIAGNOSIS — I712 Thoracic aortic aneurysm, without rupture, unspecified: Secondary | ICD-10-CM | POA: Insufficient documentation

## 2020-06-29 NOTE — Progress Notes (Signed)
Cardiothoracic Surgery consultation   PCP is Vicenta Aly, [place] Referring Provider is Burnell Blanks*  Chief Complaint  Patient presents with  . Thoracic Aortic Aneurysm    new patient consultation, ECHO CTA 06/15/20    HPI:  The patient is a 34 year old woman with no chronic medical problems who saw Dr. Angelena Form in 2017 for evaluation of chest pain.  A 2D echocardiogram and 05/2016 showed normal left ventricular systolic function with ejection fraction of 55 to 60%.  Aortic valve is trileaflet with no stenosis and trivial regurgitation.  The ascending aorta was measured at 32 mm.  The aortic root was measured at 33 mm.  She was seen by him again in June of this year for evaluation of tachycardia and palpitations after her second Covid vaccine.  She had a repeat echocardiogram on 06/13/2020 which showed no significant change in her aortic valve or left ventricular function.  The ascending aorta was measured at 3.6 cm and the aortic root at 3.7 cm.  Due to the change in size from her previous echocardiogram she underwent a CTA of the chest on 06/15/2020.  This showed the maximum diameter of the ascending aorta to be 3.6 cm.  The descending aorta at the same level was measured at 2.1 cm.  She reports some episodes of left-sided chest pain and back pain at rest.  There is no family history of connective tissue disorder, aortic aneurysm disease, or aortic dissection.  There is no family history of bicuspid aortic valve disease. Past Medical History:  Diagnosis Date  . Elevated C-reactive protein (CRP)   . Myalgia   . No pertinent past medical history   . Normal pregnancy in multigravida in third trimester 12/02/2014  . SVD (spontaneous vaginal delivery) 12/03/2014  . Tachycardia     Past Surgical History:  Procedure Laterality Date  . CHOLECYSTECTOMY N/A 03/03/2018   Procedure: LAPAROSCOPIC CHOLECYSTECTOMY;  Surgeon: Rolm Bookbinder, MD;  Location: [facility] OR;  Service: General;   Laterality: N/A;  . WISDOM TOOTH EXTRACTION      Family History  Problem Relation Age of Onset  . Heart attack Paternal Grandfather   . Melanoma Mother        stage IV   . Coronary artery disease Father   . Heart attack Father   . Transient ischemic attack Father   . Birth defects Daughter        VSD-closed  . Colon cancer Maternal Grandmother   . Non-Hodgkin's lymphoma Paternal Grandmother   . Colon polyps Neg Hx   . Esophageal cancer Neg Hx   . Rectal cancer Neg Hx   . Stomach cancer Neg Hx     Social History Social History   Tobacco Use  . Smoking status: Never Smoker  . Smokeless tobacco: Never Used  Vaping Use  . Vaping Use: Never used  Substance Use Topics  . Alcohol use: Yes    Comment: wine 1-2 times a month  . Drug use: No    Current Outpatient Medications  Medication Sig Dispense Refill  . Apple Cider Vinegar 600 MG CAPS Take 1 capsule by mouth daily.    . famotidine (PEPCID) 20 MG tablet Take 20 mg by mouth 2 (two) times daily.    . Multiple Vitamin (MULTIVITAMIN) tablet Take 1 tablet by mouth every other day.     . cholestyramine (QUESTRAN) 4 g packet Take 1 packet (4 g total) by mouth daily. Take separate from other medications by 2 hours. (Patient not  taking: Reported on 06/29/2020) 30 packet 2  . dicyclomine (BENTYL) 10 MG capsule Take 1 capsule (10 mg total) by mouth 2 (two) times daily as needed (abdominal pain). (Patient not taking: Reported on 06/29/2020) 60 capsule 2   No current facility-administered medications for this visit.    No Known Allergies  Review of Systems  Constitutional: Negative.   HENT: Negative.   Respiratory: Negative.   Cardiovascular: Positive for chest pain and palpitations. Negative for leg swelling.  Gastrointestinal:       Reflux and heartburn    BP (!) 149/93 (BP Location: Left Arm, Patient Position: Sitting, Cuff Size: Small)   Pulse 63   Resp 18   Ht 5\' 3"  (1.6 m)   Wt 114 lb (51.7 kg)   SpO2 99% Comment: RA   BMI 20.19 kg/m  Physical Exam Constitutional:      Appearance: Normal appearance. She is normal weight.  Cardiovascular:     Rate and Rhythm: Normal rate and regular rhythm.     Heart sounds: Normal heart sounds. No murmur heard.   Pulmonary:     Effort: Pulmonary effort is normal.     Breath sounds: Normal breath sounds.  Neurological:     Mental Status: She is alert.      Diagnostic Tests:  CLINICAL DATA:  34 year old female with aortic dilation on a prior echo  EXAM: CT ANGIOGRAPHY CHEST WITH CONTRAST  TECHNIQUE: Multidetector CT imaging of the chest was performed using the standard protocol during bolus administration of intravenous contrast. Multiplanar CT image reconstructions and MIPs were obtained to evaluate the vascular anatomy.  CONTRAST:  11mL OMNIPAQUE IOHEXOL 350 MG/ML SOLN  COMPARISON:  None.  FINDINGS: Cardiovascular:  Heart:  No cardiomegaly. No pericardial fluid/thickening. No significant coronary calcifications.  Aorta:  Coronal images demonstrate annulus estimated 22 mm.  Axial images demonstrate estimated diameter of the ascending aorta 36 mm.  No dissection.  No atherosclerotic changes.  Common origin of the left common carotid artery and the innominate artery.  Unremarkable course caliber contour of the distal thoracic aorta.  No significant atherosclerotic changes of the thoracic aorta.  Pulmonary arteries:  Timing of the contrast bolus not optimized for pulmonary arteries.  Mediastinum/Nodes: No mediastinal adenopathy. Unremarkable appearance of the thoracic esophagus.  Unremarkable appearance of the thoracic inlet and thyroid.  Lungs/Pleura: Central airways are clear. No pleural effusion. No confluent airspace disease.  No pneumothorax.  Upper Abdomen: No acute.  Musculoskeletal: No acute displaced fracture. Mild apex right scoliotic curvature of the thoracic spine.  Review of the MIP  images confirms the above findings.  IMPRESSION: Maximum estimated diameter of the ascending aorta is 3.6 cm.  Mild scoliotic curvature of the thoracic spine.  Signed,  Dulcy Fanny. Dellia Nims, RPVI  [facility]  [facility]   Electronically Signed   By: Corrie Mckusick D.O.   On: 06/16/2020 13:36   Impression:  This 34 year old woman has 3.6 cm fusiform enlargement of the ascending aorta with a trileaflet aortic valve.  The descending aorta at the same level measures 2.1 cm.  2D echocardiogram showed the same measurement of the ascending aorta.  Her previous echocardiogram in 2017 showed the ascending aorta measured 3.2 cm.  I suspect some of this discrepancy in the measurement from 2017-2021 on echocardiogram is due to the slightly different window and where the measurement cursor was positioned.  The current measurement of 3.6 cm is still well below the surgical threshold of 5.5 cm.  I reviewed all the CT and echo images with her and answered her questions.  I do not think her aorta is responsible for her chest or back pain.  I recommended repeating the CTA of the chest in 1 year to be sure that the ascending aorta is stable.  If there is no change in measurement at that time that I think another scan 2 years after that would be fine since the absolute size of the ascending aorta is still relatively small.  I stressed the importance of continued good blood pressure control and preventing further enlargement of the aorta and acute aortic dissection.  I told her that the risk of complications related to her aorta at its current size is less than 0.5%.  Plan:  She should have a repeat CTA in 1 year.  I told her that I would be happy to arrange a scan and see her back in follow-up or else that can be done by Dr. Angelena Form.  I spent 20 minutes performing this consultation and > 50% of this time was spent face to face counseling and  coordinating the care of this patient's ascending aortic aneurysm.   Gaye Pollack, MD [facility] [phone]

## 2020-07-17 ENCOUNTER — Ambulatory Visit: Payer: No Typology Code available for payment source | Admitting: Nurse Practitioner

## 2020-08-16 ENCOUNTER — Ambulatory Visit: Payer: No Typology Code available for payment source | Admitting: Physician Assistant

## 2020-08-27 ENCOUNTER — Ambulatory Visit: Payer: No Typology Code available for payment source | Admitting: Dermatology

## 2020-09-26 ENCOUNTER — Ambulatory Visit (INDEPENDENT_AMBULATORY_CARE_PROVIDER_SITE_OTHER): Payer: No Typology Code available for payment source | Admitting: Physician Assistant

## 2020-09-26 ENCOUNTER — Encounter: Payer: Self-pay | Admitting: Physician Assistant

## 2020-09-26 VITALS — BP 119/77 | HR 67 | Wt 118.2 lb

## 2020-09-26 DIAGNOSIS — R197 Diarrhea, unspecified: Secondary | ICD-10-CM

## 2020-09-26 DIAGNOSIS — G8929 Other chronic pain: Secondary | ICD-10-CM | POA: Diagnosis not present

## 2020-09-26 DIAGNOSIS — R109 Unspecified abdominal pain: Secondary | ICD-10-CM | POA: Diagnosis not present

## 2020-09-26 DIAGNOSIS — R0789 Other chest pain: Secondary | ICD-10-CM

## 2020-09-26 MED ORDER — OMEPRAZOLE 20 MG PO CPDR: 20.0000 mg | DELAYED_RELEASE_CAPSULE | Freq: Every day | ORAL | 3 refills | Status: DC

## 2020-09-26 NOTE — Progress Notes (Signed)
Chief Complaint: Follow-up abdominal pain, loose stool  HPI:    Brandy Ramirez is a 34 year old Caucasian female with a past medical history as listed below, known to Dr. Henrene Pastor, who presents to clinic today for follow-up of her abdominal pain and loose stool.    06/13/2020 patient saw Tye Savoy for chronic intermittent, migratory abdominal pain/chest pain with an unclear etiology.  At that time described chronic loose stools following cholecystectomy, stress could exacerbate symptoms.  Cardiac eval of chest pain was in progress at that time she was wearing cardiac monitor.  She was given a trial of Bentyl 10 mg twice daily as needed for lower abdominal pain.  She is also given a trial of a course of PPI.  Is recommend that she have an EGD if there was no improvement.  She is also given Questran.    06/29/2020 patient had a surgical consult for a thoracic aortic aneurysm.  It was recommended she have a repeat CTA in 1 year.    Today, the patient presents to clinic and tells me that after being seen in June of this year she never started the Pantoprazole because she was worried about side effects and instead has been using Pepcid 10 mg as needed which does help with the atypical chest pain she experiences.  Tells me that this is occurring now every other day though and she feels it behind her left breast and into her back.  This is rated as a 7-8/10 and seems related to eating. Tells me she has been to see the cardiologist and they reassured her that it is not a cardiac issue.    Also tells me the diarrhea is not really a problem for her.  It is just a softer stool and typically it is in the morning when she wakes up.  She never tried the Questran because she did not want to be on more medicine.  Denies having any further abdominal spasms and never use the Bentyl.     Mother of 3.    Denies fever, chills, blood in her stool, weight loss, nausea or vomiting.  Past Medical History:  Diagnosis Date  .  Elevated C-reactive protein (CRP)   . Myalgia   . No pertinent past medical history   . Normal pregnancy in multigravida in third trimester 12/02/2014  . SVD (spontaneous vaginal delivery) 12/03/2014  . Tachycardia     Past Surgical History:  Procedure Laterality Date  . CHOLECYSTECTOMY N/A 03/03/2018   Procedure: LAPAROSCOPIC CHOLECYSTECTOMY;  Surgeon: Rolm Bookbinder, MD;  Location: [facility];  Service: General;  Laterality: N/A;  . WISDOM TOOTH EXTRACTION      Current Outpatient Medications  Medication Sig Dispense Refill  . Apple Cider Vinegar 600 MG CAPS Take 1 capsule by mouth daily.    . famotidine (PEPCID) 20 MG tablet Take 20 mg by mouth 2 (two) times daily. As needed    . Multiple Vitamin (MULTIVITAMIN) tablet Take 1 tablet by mouth every other day.      No current facility-administered medications for this visit.    Allergies as of 09/26/2020  . (No Known Allergies)    Family History  Problem Relation Age of Onset  . Heart attack Paternal Grandfather   . Melanoma Mother        stage IV   . Coronary artery disease Father   . Heart attack Father   . Transient ischemic attack Father   . Birth defects Daughter  VSD-closed  . Colon cancer Maternal Grandmother   . Non-Hodgkin's lymphoma Paternal Grandmother   . Colon polyps Neg Hx   . Esophageal cancer Neg Hx   . Rectal cancer Neg Hx   . Stomach cancer Neg Hx     Social History   Socioeconomic History  . Marital status: Married    Spouse name: drew  . Number of children: 3  . Years of education: 49  . Highest education level: Not on file  Occupational History  . 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 Smoking status: Never Smoker  . Smokeless tobacco: Never Used  Vaping Use  . Vaping Use: Never used  Substance and Sexual Activity  . Alcohol use: Yes    Comment: wine 1-2 times a month  . Drug use: No  . Sexual activity: Yes    Partners: Male    Birth  control/protection: Surgical    Comment: vasectomy  Other Topics Concern  . 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 Difficulty of Paying Living Expenses: Not on file  Food Insecurity:   . Worried About Charity fundraiser in the Last Year: Not on file  . Ran Out of Food in the Last Year: Not on file  Transportation Needs:   . Lack of Transportation (Medical): Not on file  . Lack of Transportation (Non-Medical): Not on file  Physical Activity:   . Days of Exercise per Week: Not on file  . Minutes of Exercise per Session: Not on file  Stress:   . Feeling of Stress : Not on file  Social Connections:   . Frequency of Communication with Friends and Family: Not on file  . Frequency of Social Gatherings with Friends and Family: Not on file  . Attends Religious Services: Not on file  . Active Member of Clubs or Organizations: Not on file  . Attends Archivist Meetings: Not on file  . Marital Status: Not on file  Intimate Partner Violence:   . Fear of Current or Ex-Partner: Not on file  . Emotionally Abused: Not on file  . Physically Abused: Not on file  . Sexually Abused: Not on file    Review of Systems:    Constitutional: No weight loss, fever or chills Cardiovascular: No palptiations  Respiratory: No SOB  Gastrointestinal: See HPI and otherwise negative   Physical Exam:  Vital signs: BP 119/77   Pulse 67   Wt 118 lb 3.2 oz (53.6 kg)   SpO2 98%   BMI 20.94 kg/m   Constitutional:   Pleasant Caucasian female appears to be in NAD, Well developed, Well nourished, alert and cooperative Respiratory: Respirations even and unlabored. Lungs clear to auscultation bilaterally.   No wheezes, crackles, or rhonchi.  Cardiovascular: Normal S1, S2. No MRG. Regular rate and  rhythm. No peripheral edema, cyanosis or pallor.  Gastrointestinal:  Soft, nondistended, nontender. No rebound or guarding. Normal bowel sounds. No appreciable masses or hepatomegaly. Rectal:  Not performed.  Psychiatric: Demonstrates good judgement and reason without abnormal affect or behaviors.  No recent labs or imaging.  Assessment: 1.  Atypical chest pain: Continues every other day, cardiac eval negative; likely related to GERD 2.  Lower abdominal  cramping: This has resolved over the past few months; consider relation IBS versus other 3.  Diarrhea: Patient tells me softer than normal stool, this is only been since her cholecystectomy, really does not hinder her daily life and she is not worried about it; most likely postcholecystectomy  Plan: 1.  Discussed an EGD with the patient.  She would like to hold for now and try medication.  Prescribed Omeprazole 20 mg daily, 30-60 minutes before breakfast #30 with 3 refills. 2.  Discussed with the patient that if above is not working she should let us know after the first week and we can try increasing this medicine at first.  If it is still not working then we will need to proceed with EGD for further evaluation. 3.  Patient to follow in clinic with me in 2 months.  Ellouise Newer, PA-C [facility] 09/26/2020, 2:03 PM  Cc: Vicenta Aly, [place]

## 2020-09-26 NOTE — Patient Instructions (Addendum)
If you are age 35 or older, your body mass index should be between 23-30. Your Body mass index is 20.94 kg/m. If this is out of the aforementioned range listed, please consider follow up with your Primary Care Provider.  If you are age 48 or younger, your body mass index should be between 19-25. Your Body mass index is 20.94 kg/m. If this is out of the aformentioned range listed, please consider follow up with your Primary Care Provider.   We have sent the following medications to your pharmacy for you to pick up at your convenience:  START: omeprazole 20mg  take one capsule 30 to 60 minutes beofre breakfast each day.  You will be seen in the office for follow up on 11-14-20 at 3:00pm.  Thank you for entrusting me with your care and choosing [facility].  Ellouise Newer, [place]

## 2020-09-26 NOTE — Addendum Note (Signed)
Addended by: Stevan Born on: 09/26/2020 02:30 PM   Modules accepted: Orders

## 2020-09-26 NOTE — Progress Notes (Signed)
Assessment and plan noted ?

## 2020-10-11 ENCOUNTER — Telehealth: Payer: Self-pay

## 2020-10-11 ENCOUNTER — Encounter: Payer: Self-pay | Admitting: Internal Medicine

## 2020-10-11 NOTE — Telephone Encounter (Signed)
-----   Message from Levin Erp, [place] sent at 10/11/2020  1:25 PM EDT ----- Regarding: call patient Can you call patient and see if she would like EGD. This would be with Dr. Gertie Exon in the [facility]. Thanks-JLL

## 2020-10-11 NOTE — Telephone Encounter (Signed)
Message left for pt to call when she is ready to schedule EGD.

## 2020-10-30 ENCOUNTER — Encounter: Payer: Self-pay | Admitting: Dermatology

## 2020-10-30 ENCOUNTER — Other Ambulatory Visit: Payer: Self-pay

## 2020-10-30 ENCOUNTER — Ambulatory Visit (INDEPENDENT_AMBULATORY_CARE_PROVIDER_SITE_OTHER): Payer: No Typology Code available for payment source | Admitting: Dermatology

## 2020-10-30 DIAGNOSIS — S90222D Contusion of left lesser toe(s) with damage to nail, subsequent encounter: Secondary | ICD-10-CM | POA: Diagnosis not present

## 2020-10-30 DIAGNOSIS — Z1283 Encounter for screening for malignant neoplasm of skin: Secondary | ICD-10-CM

## 2020-10-30 DIAGNOSIS — D229 Melanocytic nevi, unspecified: Secondary | ICD-10-CM

## 2020-10-30 DIAGNOSIS — D225 Melanocytic nevi of trunk: Secondary | ICD-10-CM

## 2020-11-03 NOTE — Progress Notes (Signed)
   Follow-Up Visit   Subjective  Brandy Ramirez is a 34 y.o. female who presents for the following: Follow-up (toe nail must have been a bruise stated by the patient its gone (toe is polished cant see results) no other concerns).  Check moles and recheck toenail Location:  Duration:  Quality:  Associated Signs/Symptoms: Modifying Factors:  Severity:  Timing: Context:   Objective  Well appearing patient in no apparent distress; mood and affect are within normal limits.  A full examination was performed including scalp, head, eyes, ears, nose, lips, neck, chest, axillae, abdomen, back, buttocks, bilateral upper extremities, bilateral lower extremities, hands, feet, fingers, toes, fingernails, and toenails. All findings within normal limits unless otherwise noted below.   Assessment & Plan    Skin exam for malignant neoplasm Chest - [facility])  Annual skin exam  Subungual hemorrhage of toenail, left, subsequent encounter Left Hallux Toe Nail Plate  No follow-up needed.  Atypical mole Mid Back  She will examine her own skin twice annually and I will continue to do a once year general skin check.     I, Lavonna Monarch, MD, have reviewed all documentation for this visit.  The documentation on 11/03/20 for the exam, diagnosis, procedures, and orders are all accurate and complete.

## 2020-11-05 DIAGNOSIS — I77819 Aortic ectasia, unspecified site: Secondary | ICD-10-CM

## 2020-11-14 ENCOUNTER — Ambulatory Visit: Payer: No Typology Code available for payment source | Admitting: Physician Assistant

## 2020-11-21 ENCOUNTER — Other Ambulatory Visit: Payer: Self-pay

## 2020-11-21 ENCOUNTER — Ambulatory Visit (AMBULATORY_SURGERY_CENTER): Payer: Self-pay | Admitting: *Deleted

## 2020-11-21 VITALS — Ht 63.0 in | Wt 117.0 lb

## 2020-11-21 DIAGNOSIS — R0789 Other chest pain: Secondary | ICD-10-CM

## 2020-11-21 DIAGNOSIS — R109 Unspecified abdominal pain: Secondary | ICD-10-CM

## 2020-11-21 DIAGNOSIS — G8929 Other chronic pain: Secondary | ICD-10-CM

## 2020-11-21 NOTE — Progress Notes (Signed)
Fully vax'd ° °No egg or soy allergy known to patient  °No issues with past sedation with any surgeries or procedures °no intubation problems in the past  °No FH of Malignant Hyperthermia °No diet pills per patient °No home 02 use per patient  °No blood thinners per patient  °Pt denies issues with constipation  °No A fib or A flutter  °EMMI video to pt or via MyChart  °COVID 19 guidelines implemented in PV today with Pt and RN  ° ° °Due to the COVID-19 pandemic we are asking patients to follow these guidelines. Please only bring one care partner. Please be aware that your care partner may wait in the car in the parking lot or if they feel like they will be too hot to wait in the car, they may wait in the lobby on the 4th floor. All care partners are required to wear a mask the entire time (we do not have any that we can provide them), they need to practice social distancing, and we will do a Covid check for all patient's and care partners when you arrive. Also we will check their temperature and your temperature. If the care partner waits in their car they need to stay in the parking lot the entire time and we will call them on their cell phone when the patient is ready for discharge so they can bring the car to the front of the building. Also all patient's will need to wear a mask into building. ° °

## 2020-11-30 ENCOUNTER — Encounter: Payer: Self-pay | Admitting: Internal Medicine

## 2020-12-04 ENCOUNTER — Encounter: Payer: Self-pay | Admitting: Internal Medicine

## 2020-12-04 ENCOUNTER — Other Ambulatory Visit: Payer: Self-pay

## 2020-12-04 ENCOUNTER — Ambulatory Visit (AMBULATORY_SURGERY_CENTER): Payer: No Typology Code available for payment source | Admitting: Internal Medicine

## 2020-12-04 VITALS — BP 128/82 | HR 70 | Temp 98.0°F | Resp 16 | Ht 63.0 in | Wt 117.0 lb

## 2020-12-04 DIAGNOSIS — K219 Gastro-esophageal reflux disease without esophagitis: Secondary | ICD-10-CM

## 2020-12-04 DIAGNOSIS — R0789 Other chest pain: Secondary | ICD-10-CM | POA: Diagnosis not present

## 2020-12-04 DIAGNOSIS — G8929 Other chronic pain: Secondary | ICD-10-CM

## 2020-12-04 DIAGNOSIS — R109 Unspecified abdominal pain: Secondary | ICD-10-CM

## 2020-12-04 MED ORDER — SODIUM CHLORIDE 0.9 % IV SOLN: 500.0000 mL | Freq: Once | INTRAVENOUS | Status: DC

## 2020-12-04 NOTE — Op Note (Signed)
[place] Patient Name: Brandy Ramirez Procedure Date: 12/04/2020 3:24 PM MRN: [id number] Endoscopist: Docia Chuck. Henrene Pastor , MD Age: 34 Referring MD:  Date of Birth: 08/11/1986 Gender: Female Account #: [id number] Procedure:                Upper GI endoscopy Indications:              Chest pain (non cardiac). Chronic Medicines:                Monitored Anesthesia Care Procedure:                Pre-Anesthesia Assessment:                           - Prior to the procedure, a History and Physical                            was performed, and patient medications and                            allergies were reviewed. The patient's tolerance of                            previous anesthesia was also reviewed. The risks                            and benefits of the procedure and the sedation                            options and risks were discussed with the patient.                            All questions were answered, and informed consent                            was obtained. Prior Anticoagulants: The patient has                            taken no previous anticoagulant or antiplatelet                            agents. ASA Grade Assessment: I - A normal, healthy                            patient. After reviewing the risks and benefits,                            the patient was deemed in satisfactory condition to                            undergo the procedure.                           After obtaining informed consent, the endoscope was  passed under direct vision. Throughout the                            procedure, the patient's blood pressure, pulse, and                            oxygen saturations were monitored continuously. The                            Endoscope was introduced through the mouth, and                            advanced to the second part of duodenum. The upper                            GI endoscopy was accomplished  without difficulty.                            The patient tolerated the procedure well. Scope In: Scope Out: Findings:                 The esophagus revealed mild esophagitis as                            manifested by slight edema and hyperemia at the                            mucosal Z-line.                           The stomach was normal.                           The examined duodenum was normal.                           The cardia and gastric fundus were normal on                            retroflexion. Complications:            No immediate complications. Estimated Blood Loss:     Estimated blood loss: none. Impression:               1. Mild reflux esophagitis                           2. Otherwise normal EGD                           3. Chronic atypical chest pain. Recommendation:           - Patient has a contact number available for                            emergencies. The signs and symptoms of potential  delayed complications were discussed with the                            patient. Return to normal activities tomorrow.                            Written discharge instructions were provided to the                            patient.                           - Resume previous diet.                           - Continue present medications.                           - You could consider a 1 month trial of omeprazole                            20 mg daily. See if this impacts the frequency of                            your chest pain. Alternatively, you could consider                            using an acids on demand such as Mylanta or Maalox                           - Return to the care of your primary provider. GI                            follow-up as needed Docia Chuck. Henrene Pastor, MD 12/04/2020 3:49:13 PM This report has been signed electronically.

## 2020-12-04 NOTE — Progress Notes (Signed)
A/ox3, pleased with MAC, report to RN 

## 2020-12-04 NOTE — Patient Instructions (Signed)
Thank you for allowing [place] to care for you today!  Resume previous diet and medications today.  See report for recommendations for taking Prilosec versus on-demand antacids as needed.  Resume your normal activities tomorrow.    YOU HAD AN ENDOSCOPIC PROCEDURE TODAY AT [facility]:   Refer to the procedure report that was given to you for any specific questions about what was found during the examination.  If the procedure report does not answer your questions, please call your gastroenterologist to clarify.  If you requested that your care partner not be given the details of your procedure findings, then the procedure report has been included in a sealed envelope for you to review at your convenience later.  YOU SHOULD EXPECT: Some feelings of bloating in the abdomen. Passage of more gas than usual.  Walking can help get rid of the air that was put into your GI tract during the procedure and reduce the bloating. If you had a lower endoscopy (such as a colonoscopy or flexible sigmoidoscopy) you may notice spotting of blood in your stool or on the toilet paper. If you underwent a bowel prep for your procedure, you may not have a normal bowel movement for a few days.  Please Note:  You might notice some irritation and congestion in your nose or some drainage.  This is from the oxygen used during your procedure.  There is no need for concern and it should clear up in a day or so.  SYMPTOMS TO REPORT IMMEDIATELY:   Following lower endoscopy (colonoscopy or flexible sigmoidoscopy):  Excessive amounts of blood in the stool  Significant tenderness or worsening of abdominal pains  Swelling of the abdomen that is new, acute  Fever of 100F or higher   Following upper endoscopy (EGD)  Vomiting of blood or coffee ground material  New chest pain or pain under the shoulder blades  Painful or persistently difficult swallowing  New shortness of breath  Fever of 100F or higher  Black,  tarry-looking stools  For urgent or emergent issues, a gastroenterologist can be reached at any hour by calling [phone]. Do not use MyChart messaging for urgent concerns.    DIET:  We do recommend a small meal at first, but then you may proceed to your regular diet.  Drink plenty of fluids but you should avoid alcoholic beverages for 24 hours.  ACTIVITY:  You should plan to take it easy for the rest of today and you should NOT DRIVE or use heavy machinery until tomorrow (because of the sedation medicines used during the test).    FOLLOW UP: Our staff will call the number listed on your records 48-72 hours following your procedure to check on you and address any questions or concerns that you may have regarding the information given to you following your procedure. If we do not reach you, we will leave a message.  We will attempt to reach you two times.  During this call, we will ask if you have developed any symptoms of COVID 19. If you develop any symptoms (ie: fever, flu-like symptoms, shortness of breath, cough etc.) before then, please call [phone].  If you test positive for Covid 19 in the 2 weeks post procedure, please call and report this information to [place].    If any biopsies were taken you will be contacted by phone or by letter within the next 1-3 weeks.  Please call us at [phone] if you have not heard about  the biopsies in 3 weeks.    SIGNATURES/CONFIDENTIALITY: You and/or your care partner have signed paperwork which will be entered into your electronic medical record.  These signatures attest to the fact that that the information above on your After Visit Summary has been reviewed and is understood.  Full responsibility of the confidentiality of this discharge information lies with you and/or your care-partner.YOU HAD AN ENDOSCOPIC PROCEDURE TODAY AT [facility]:   Refer to the procedure report that was given to you for any specific questions about  what was found during the examination.  If the procedure report does not answer your questions, please call your gastroenterologist to clarify.  If you requested that your care partner not be given the details of your procedure findings, then the procedure report has been included in a sealed envelope for you to review at your convenience later.  YOU SHOULD EXPECT: Some feelings of bloating in the abdomen. Passage of more gas than usual.  Walking can help get rid of the air that was put into your GI tract during the procedure and reduce the bloating. If you had a lower endoscopy (such as a colonoscopy or flexible sigmoidoscopy) you may notice spotting of blood in your stool or on the toilet paper. If you underwent a bowel prep for your procedure, you may not have a normal bowel movement for a few days.  Please Note:  You might notice some irritation and congestion in your nose or some drainage.  This is from the oxygen used during your procedure.  There is no need for concern and it should clear up in a day or so.  SYMPTOMS TO REPORT IMMEDIATELY:    Following upper endoscopy (EGD)  Vomiting of blood or coffee ground material  New chest pain or pain under the shoulder blades  Painful or persistently difficult swallowing  New shortness of breath  Fever of 100F or higher  Black, tarry-looking stools  For urgent or emergent issues, a gastroenterologist can be reached at any hour by calling [phone]. Do not use MyChart messaging for urgent concerns.    DIET:  We do recommend a small meal at first, but then you may proceed to your regular diet.  Drink plenty of fluids but you should avoid alcoholic beverages for 24 hours.  ACTIVITY:  You should plan to take it easy for the rest of today and you should NOT DRIVE or use heavy machinery until tomorrow (because of the sedation medicines used during the test).    FOLLOW UP: Our staff will call the number listed on your records 48-72 hours  following your procedure to check on you and address any questions or concerns that you may have regarding the information given to you following your procedure. If we do not reach you, we will leave a message.  We will attempt to reach you two times.  During this call, we will ask if you have developed any symptoms of COVID 19. If you develop any symptoms (ie: fever, flu-like symptoms, shortness of breath, cough etc.) before then, please call [phone].  If you test positive for Covid 19 in the 2 weeks post procedure, please call and report this information to [place].    If any biopsies were taken you will be contacted by phone or by letter within the next 1-3 weeks.  Please call us at [phone] if you have not heard about the biopsies in 3 weeks.    SIGNATURES/CONFIDENTIALITY: You and/or your care  partner have signed paperwork which will be entered into your electronic medical record.  These signatures attest to the fact that that the information above on your After Visit Summary has been reviewed and is understood.  Full responsibility of the confidentiality of this discharge information lies with you and/or your care-partner.

## 2020-12-04 NOTE — Progress Notes (Signed)
Pt's states no medical or surgical changes since previsit or office visit.  VS CW  

## 2020-12-06 ENCOUNTER — Telehealth: Payer: Self-pay | Admitting: *Deleted

## 2020-12-06 NOTE — Telephone Encounter (Signed)
  Follow up Call-  Call back number 12/04/2020  Post procedure Call Back phone  # [phone]  Permission to leave phone message Yes  Some recent data might be hidden     Patient questions:  Do you have a fever, pain , or abdominal swelling? No. Pain Score  0 *  Have you tolerated food without any problems? Yes.    Have you been able to return to your normal activities? Yes.    Do you have any questions about your discharge instructions: Diet   No. Medications  No. Follow up visit  No.  Do you have questions or concerns about your Care? No.  Actions: * If pain score is 4 or above: No action needed, pain <4.  1. Have you developed a fever since your procedure? no  2.   Have you had an respiratory symptoms (SOB or cough) since your procedure? no  3.   Have you tested positive for COVID 19 since your procedure no  4.   Have you had any family members/close contacts diagnosed with the COVID 19 since your procedure?  no   If yes to any of these questions please route to Joylene John, RN and Joella Prince, RN

## 2020-12-31 ENCOUNTER — Ambulatory Visit (HOSPITAL_COMMUNITY): Payer: No Typology Code available for payment source

## 2021-01-06 ENCOUNTER — Ambulatory Visit (HOSPITAL_COMMUNITY): Payer: No Typology Code available for payment source

## 2021-01-14 ENCOUNTER — Encounter: Payer: Self-pay | Admitting: Dermatology

## 2021-01-14 ENCOUNTER — Telehealth: Payer: Self-pay | Admitting: Dermatology

## 2021-01-14 NOTE — Telephone Encounter (Signed)
Patient called about mark of great toe. She wanted to know if ST had documented a horizontal or vertical mark because she could not remember and thinks the make may have changed. She was seen originally in April of 2021 for this issue. I offered to schedule appointment. She said she wanted to send ST a message in [place] with image of toenail and see what he thinks.

## 2021-01-18 ENCOUNTER — Ambulatory Visit (HOSPITAL_COMMUNITY)
Admission: RE | Admit: 2021-01-18 | Discharge: 2021-01-18 | Disposition: A | Discharge status: Home / Self Care | Payer: No Typology Code available for payment source | Source: Ambulatory Visit | Attending: Cardiovascular Disease | Admitting: Cardiovascular Disease

## 2021-01-18 ENCOUNTER — Other Ambulatory Visit: Payer: Self-pay

## 2021-01-18 DIAGNOSIS — I77819 Aortic ectasia, unspecified site: Secondary | ICD-10-CM | POA: Diagnosis present

## 2021-01-18 MED ORDER — GADOBUTROL 1 MMOL/ML IV SOLN
5.5000 mL | Freq: Once | INTRAVENOUS | Status: AC | PRN
  Administered 2021-01-18: 5.5 mL via INTRAVENOUS

## 2021-05-29 ENCOUNTER — Other Ambulatory Visit: Payer: Self-pay | Admitting: *Deleted

## 2021-05-29 DIAGNOSIS — I712 Thoracic aortic aneurysm, without rupture, unspecified: Secondary | ICD-10-CM

## 2021-05-30 ENCOUNTER — Encounter: Payer: Self-pay | Admitting: Surgery

## 2021-07-10 ENCOUNTER — Other Ambulatory Visit: Payer: Self-pay

## 2021-07-10 ENCOUNTER — Ambulatory Visit (INDEPENDENT_AMBULATORY_CARE_PROVIDER_SITE_OTHER): Payer: No Typology Code available for payment source | Admitting: Surgery

## 2021-07-10 ENCOUNTER — Encounter: Payer: Self-pay | Admitting: Surgery

## 2021-07-10 ENCOUNTER — Ambulatory Visit
Admission: RE | Admit: 2021-07-10 | Discharge: 2021-07-10 | Disposition: A | Discharge status: Home / Self Care | Payer: No Typology Code available for payment source | Source: Ambulatory Visit | Attending: Surgery | Admitting: Surgery

## 2021-07-10 VITALS — BP 125/85 | HR 65 | Resp 20 | Ht 63.0 in | Wt 121.0 lb

## 2021-07-10 DIAGNOSIS — I712 Thoracic aortic aneurysm, without rupture, unspecified: Secondary | ICD-10-CM

## 2021-07-10 MED ORDER — IOPAMIDOL (ISOVUE-370) INJECTION 76%
75.0000 mL | Freq: Once | INTRAVENOUS | Status: AC | PRN
  Administered 2021-07-10: 75 mL via INTRAVENOUS

## 2021-07-10 NOTE — Progress Notes (Signed)
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  The distal descending aorta has been 2.1 cm.  She has been feeling well without any chest or back pain.  She has had no change to her medical history.  She had an MRI of the chest done by Dr. Angelena Form on 01/18/2021 which showed the aortic root to measure 3.5 to 3.7 cm with the ascending aorta measuring 3.1 cm.  The proximal aortic arch was 2.5 cm in the distal arch 2.0 cm.  The descending thoracic aorta is 1.7 cm.  Current Outpatient Medications  Medication Sig Dispense Refill   Apple Cider Vinegar 600 MG CAPS Take 1 capsule by mouth daily.     famotidine (PEPCID) 20 MG tablet Take 20 mg by mouth 2 (two) times daily. As needed     Multiple Vitamin (MULTIVITAMIN) tablet Take 1 tablet by mouth every other day.      No current facility-administered medications for this visit.     Physical Exam: BP 125/85   Pulse 65   Resp 20   Ht '5\' 3"'$  (1.6 m)   Wt 121 lb (54.9 kg)   SpO2 98% Comment: RA  BMI 21.43 kg/m  She looks well. Cardiac exam shows a regular rate and rhythm with normal heart sounds.  There is no murmur.  Lungs are clear.  Diagnostic Tests:  Narrative & Impression  CLINICAL DATA:  Follow-up TAA   EXAM: CT ANGIOGRAPHY CHEST WITH CONTRAST   TECHNIQUE: Multidetector CT imaging of the chest was performed using the standard protocol during bolus administration of intravenous contrast. Multiplanar CT image reconstructions and MIPs were obtained to evaluate the vascular anatomy.   CONTRAST:  55m ISOVUE-370 IOPAMIDOL (ISOVUE-370) INJECTION 76%   COMPARISON:  06/15/2020   FINDINGS: Cardiovascular: Preferential opacification of the thoracic aorta. Unchanged caliber of the tubular ascending thoracic aorta, measuring up to 3.6 x 3.5 cm. Aortic valve measures 2.0 cm, sinuses of Valsalva measure up to 3.4 cm in caliber. Normal  heart size. No pericardial effusion.   Mediastinum/Nodes: No enlarged mediastinal, hilar, or axillary lymph nodes. Thyroid gland, trachea, and esophagus demonstrate no significant findings.   Lungs/Pleura: Lungs are clear. No pleural effusion or pneumothorax.   Upper Abdomen: No acute abnormality.  Hepatic steatosis.   Musculoskeletal: No chest wall abnormality. No acute or significant osseous findings. Mild dextroscoliosis of the thoracic spine.   Review of the MIP images confirms the above findings.   IMPRESSION: 1. Unchanged caliber of the tubular ascending thoracic aorta, measuring up to 3.6 x 3.5 cm, within normal limits. 2. Hepatic steatosis.     Electronically Signed   By: AEddie CandleM.D.   On: 07/10/2021 14:34      Impression:  She has had no significant change in the size of her ascending aorta at 3.5 x 3.6 cm.  Her descending thoracic aorta is still about 2.1 cm.  She has a trileaflet aortic valve.  This is still well below the usual surgical threshold of 5.5 cm but given the relatively small size of her descending thoracic aorta I would consider an ascending aorta more than 2 times the size of the descending aorta to be significant.  I reviewed the CTA images with her and answered all of her questions.  Her aorta has been stable and I think we can wait 2 years to repeat her CTA.  I stressed the importance of  continued good blood pressure control in preventing further enlargement and acute aortic dissection.  I advised her against doing any heavy lifting that may require a Valsalva maneuver and also advised against taking quinolone antibiotics that have been associated with enlargement of aortic aneurysms.  Plan: She will return to see me in 2 years with a CTA of the chest.  I spent 15 minutes performing this established patient evaluation and > 50% of this time was spent face to face counseling and coordinating the care of this patient's aortic aneurysm.    Gaye Pollack, MD [facility] [phone]

## 2021-10-30 ENCOUNTER — Ambulatory Visit: Payer: No Typology Code available for payment source | Admitting: Dermatology

## 2021-11-14 ENCOUNTER — Other Ambulatory Visit: Payer: Self-pay

## 2021-11-14 ENCOUNTER — Encounter: Payer: Self-pay | Admitting: Emergency Medicine

## 2021-11-14 ENCOUNTER — Ambulatory Visit
Admission: EM | Admit: 2021-11-14 | Discharge: 2021-11-14 | Disposition: A | Discharge status: Home / Self Care | Payer: No Typology Code available for payment source | Attending: Urgent Care | Admitting: Urgent Care

## 2021-11-14 DIAGNOSIS — Z20822 Contact with and (suspected) exposure to covid-19: Secondary | ICD-10-CM

## 2021-11-14 DIAGNOSIS — R0981 Nasal congestion: Secondary | ICD-10-CM

## 2021-11-14 DIAGNOSIS — R052 Subacute cough: Secondary | ICD-10-CM

## 2021-11-14 DIAGNOSIS — J018 Other acute sinusitis: Secondary | ICD-10-CM

## 2021-11-14 MED ORDER — AMOXICILLIN 875 MG PO TABS: 875.0000 mg | ORAL_TABLET | Freq: Two times a day (BID) | ORAL | 0 refills | Status: DC

## 2021-11-14 MED ORDER — PROMETHAZINE-DM 6.25-15 MG/5ML PO SYRP: 5.0000 mL | ORAL_SOLUTION | Freq: Every evening | ORAL | 0 refills | Status: DC | PRN

## 2021-11-14 MED ORDER — PSEUDOEPHEDRINE HCL 30 MG PO TABS: 30.0000 mg | ORAL_TABLET | Freq: Three times a day (TID) | ORAL | 0 refills | Status: DC | PRN

## 2021-11-14 MED ORDER — CETIRIZINE HCL 10 MG PO TABS: 10.0000 mg | ORAL_TABLET | Freq: Every day | ORAL | 0 refills | Status: DC

## 2021-11-14 MED ORDER — BENZONATATE 100 MG PO CAPS: 100.0000 mg | ORAL_CAPSULE | Freq: Three times a day (TID) | ORAL | 0 refills | Status: DC | PRN

## 2021-11-14 NOTE — ED Provider Notes (Signed)
[name]   MRN: [id number] DOB: [birth year]  Subjective:   Brandy Ramirez is a 35 y.o. female presenting for 1 week history of progressively worsening malaise, fatigue, sinus congestion, sinus pressure, postnasal drainage and persistent cough.  Now having coughing fits. Had 1 sick contact with her daughter, tested negative for flu, covid, strep.  No history of asthma.  No smoking history.  No current facility-administered medications for this encounter.  Current Outpatient Medications:    Apple Cider Vinegar 600 MG CAPS, Take 1 capsule by mouth daily., Disp: , Rfl:    famotidine (PEPCID) 20 MG tablet, Take 20 mg by mouth 2 (two) times daily. As needed, Disp: , Rfl:    Multiple Vitamin (MULTIVITAMIN) tablet, Take 1 tablet by mouth every other day. , Disp: , Rfl:    No Known Allergies  Past Medical History:  Diagnosis Date   Atypical chest pain    Elevated C-reactive protein (CRP)    Myalgia    No pertinent past medical history    Normal pregnancy in multigravida in third trimester 12/02/2014   SVD (spontaneous vaginal delivery) 12/03/2014   Tachycardia    Thoracic aortic aneurysm ([place])    follows woth Cardio- Echo EF 62 % no AVS     Past Surgical History:  Procedure Laterality Date   CHOLECYSTECTOMY N/A 03/03/2018   Procedure: LAPAROSCOPIC CHOLECYSTECTOMY;  Surgeon: Rolm Bookbinder, MD;  Location: [facility] OR;  Service: General;  Laterality: N/A;   WISDOM TOOTH EXTRACTION      Family History  Problem Relation Age of Onset   Heart attack Paternal Grandfather    Melanoma Mother        stage IV    Coronary artery disease Father    Heart attack Father    Transient ischemic attack Father    Colon polyps Father    Birth defects Daughter        VSD-closed   Colon cancer Maternal Grandmother    Non-Hodgkin's lymphoma Paternal Grandmother    Esophageal cancer Neg Hx    Rectal cancer Neg Hx    Stomach cancer Neg Hx     Social History   Tobacco Use    Smoking status: Never   Smokeless tobacco: Never  Vaping Use   Vaping Use: Never used  Substance Use Topics   Alcohol use: Yes    Comment: wine 1-2 times a month   Drug use: No    ROS   Objective:   Vitals: BP (!) 158/101 (BP Location: Right Arm)   Pulse 73   Temp 97.6 F (36.4 C) (Oral)   Resp 18   LMP 11/10/2021 (Exact Date)   SpO2 98%   Physical Exam Constitutional:      General: She is not in acute distress.    Appearance: Normal appearance. She is well-developed. She is not ill-appearing, toxic-appearing or diaphoretic.  HENT:     Head: Normocephalic and atraumatic.     Nose: Congestion present. No rhinorrhea.     Mouth/Throat:     Mouth: Mucous membranes are moist.     Pharynx: No oropharyngeal exudate or posterior oropharyngeal erythema.  Eyes:     General: No scleral icterus.       Right eye: No discharge.        Left eye: No discharge.     Extraocular Movements: Extraocular movements intact.     Pupils: Pupils are equal, round, and reactive to light.  Cardiovascular:     Rate and Rhythm: Normal  rate and regular rhythm.     Pulses: Normal pulses.     Heart sounds: Normal heart sounds. No murmur heard.   No friction rub. No gallop.  Pulmonary:     Effort: Pulmonary effort is normal. No respiratory distress.     Breath sounds: Normal breath sounds. No stridor. No wheezing, rhonchi or rales.     Comments: Persistent coughing fits throughout visit. Skin:    General: Skin is warm and dry.     Findings: No rash.  Neurological:     Mental Status: She is alert and oriented to person, place, and time.  Psychiatric:        Mood and Affect: Mood normal.        Behavior: Behavior normal.        Thought Content: Thought content normal.    Assessment and Plan :   PDMP not reviewed this encounter.  1. Acute non-recurrent sinusitis of other sinus   2. Exposure to COVID-19 virus   3. Sinus congestion   4. Subacute cough    Will start empiric treatment for  sinusitis with amoxicillin.  Recommended supportive care otherwise including the use of oral antihistamine, decongestant, cough suppression medications. Deferred imaging given clear cardiopulmonary exam, hemodynamically stable vital signs.  COVID and flu test pending.  Counseled patient on potential for adverse effects with medications prescribed/recommended today, ER and return-to-clinic precautions discussed, patient verbalized understanding.    Jaynee Eagles, [place] 11/14/21 [id number]

## 2021-11-14 NOTE — ED Triage Notes (Signed)
Cough, nasal congestion, x 1 week.

## 2021-11-15 LAB — COVID-19, FLU A+B NAA
Influenza A, NAA: DETECTED — AB
Influenza B, NAA: NOT DETECTED
SARS-CoV-2, NAA: NOT DETECTED

## 2021-12-12 IMAGING — CT CT ANGIO CHEST
3 of 8 series · 18 of 46 positions shown · IV contrast (OMNIPAQUE 350)
Comparison: None.

CLINICAL DATA: 33-year-old female with aortic dilation on a prior
echo

EXAM:
CT ANGIOGRAPHY CHEST WITH CONTRAST
TECHNIQUE: Multidetector CT imaging of the chest was performed using the
standard protocol during bolus administration of intravenous
contrast. Multiplanar CT image reconstructions and MIPs were
obtained to evaluate the vascular anatomy.
CONTRAST:  100mL OMNIPAQUE IOHEXOL 350 MG/ML SOLN

[Series 4: aorta 3.0 bf37 2 · axial · 0.58mm/px · z∈[-314,-32]mm · 13 of 110 slices shown]
[im 8/110  lung]
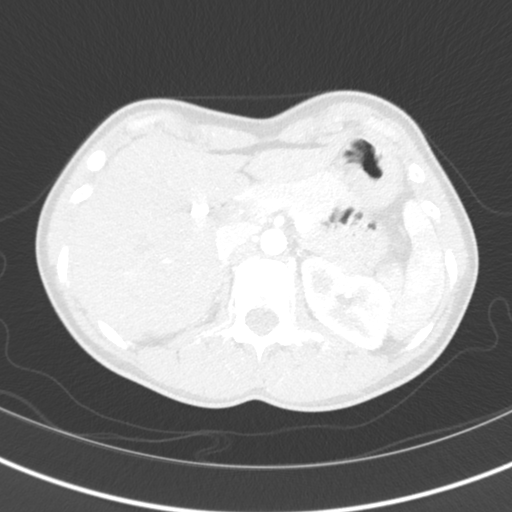
[im 16/110  soft-tissue]
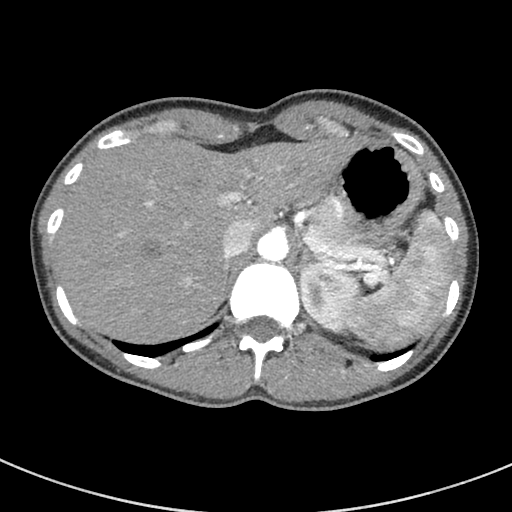
[im 24/110  lung]
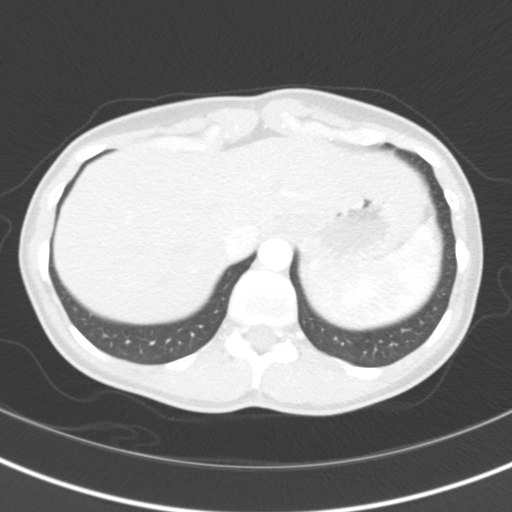
[im 32/110  soft-tissue]
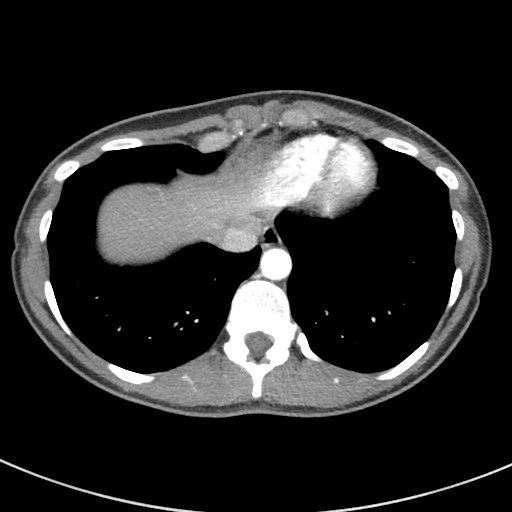
[im 39/110  lung]
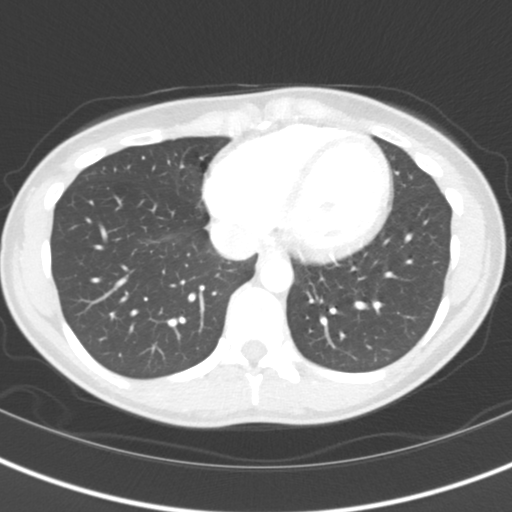
[im 47/110  soft-tissue]
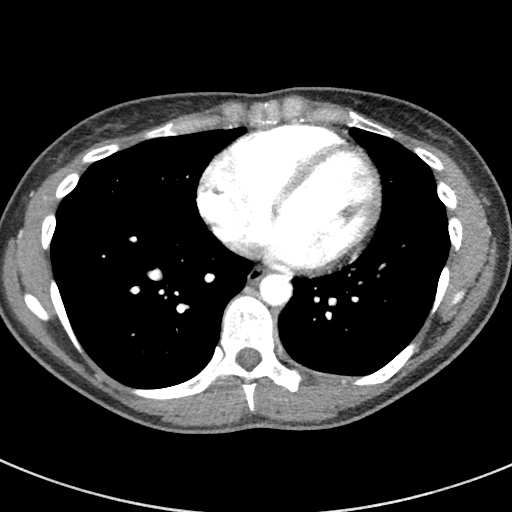
[im 55/110  lung]
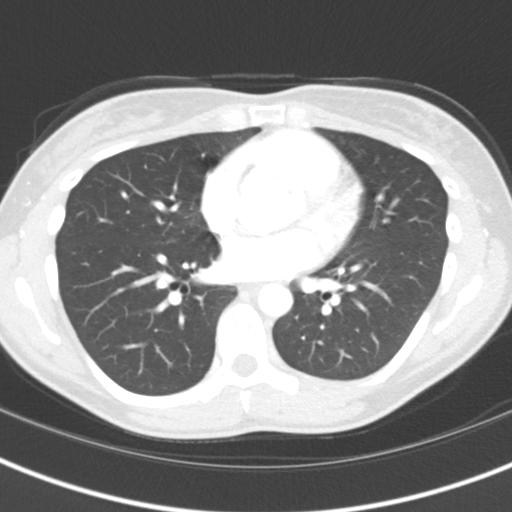
[im 63/110  soft-tissue]
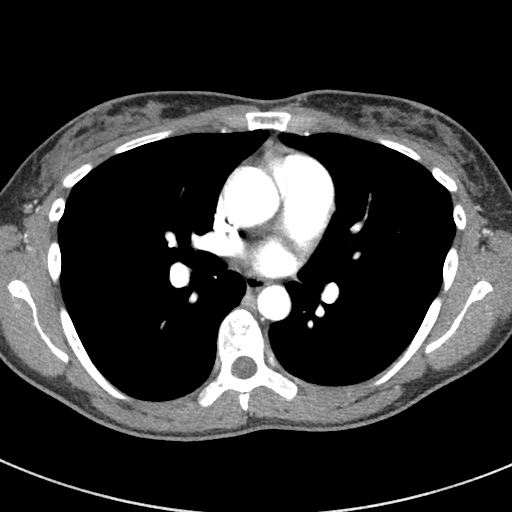
[im 71/110  lung]
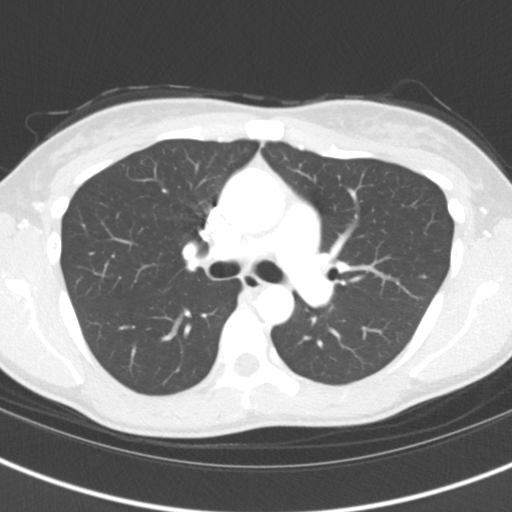
[im 78/110  soft-tissue]
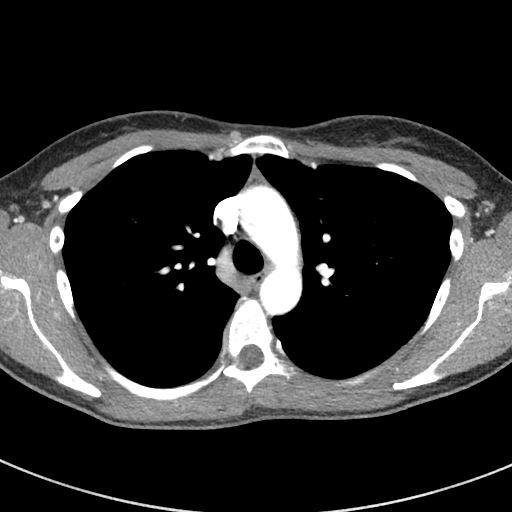
[im 86/110  lung]
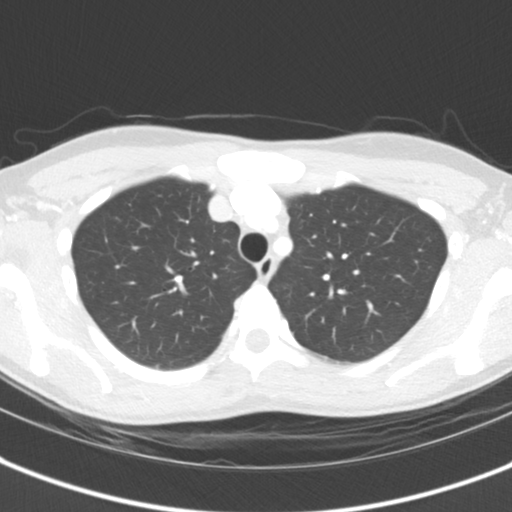
[im 94/110  soft-tissue]
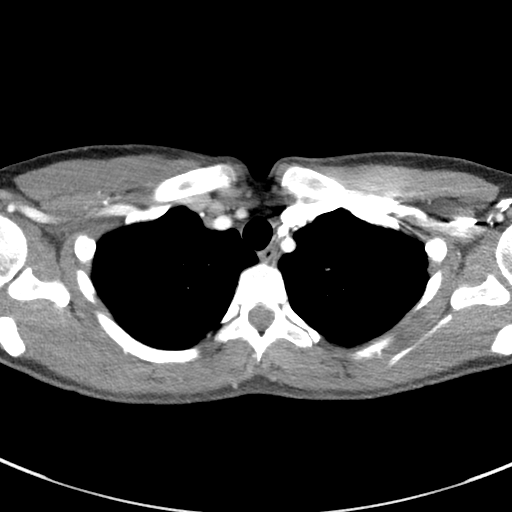
[im 102/110  lung]
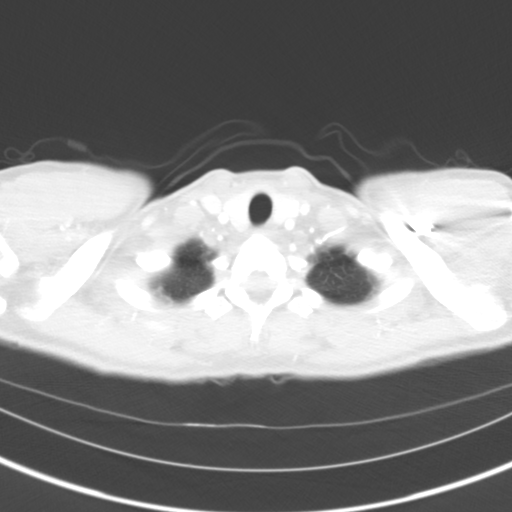

[Series 5: lung · axial · 0.58mm/px · z∈[-314,-266]mm · 2 of 110 slices shown]
[im 8/110  soft-tissue]
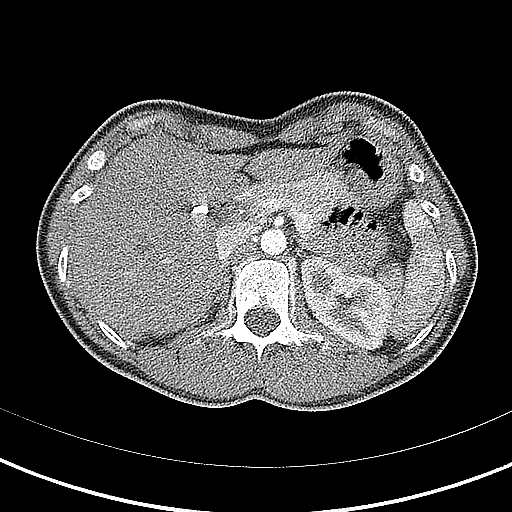
[im 24/110  soft-tissue]
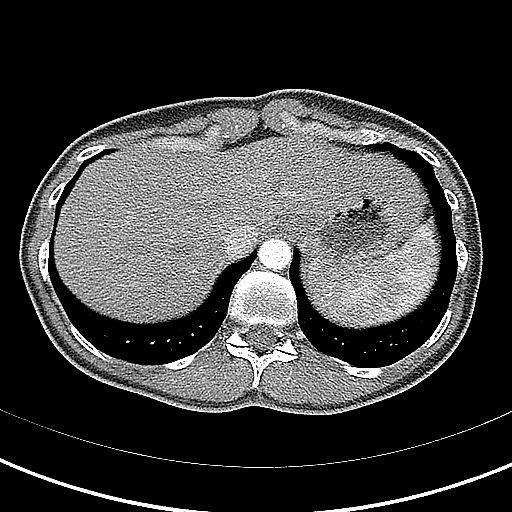

[Series 7: coronals · coronal · 0.55mm/px · 3 of 103 slices shown]
[im 26/103  soft-tissue]
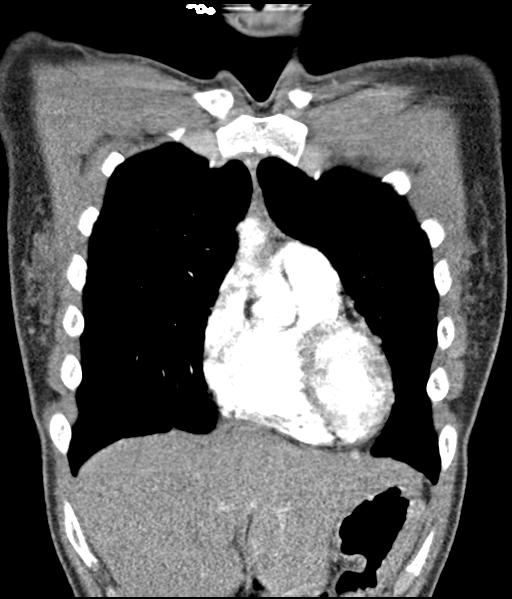
[im 52/103  soft-tissue]
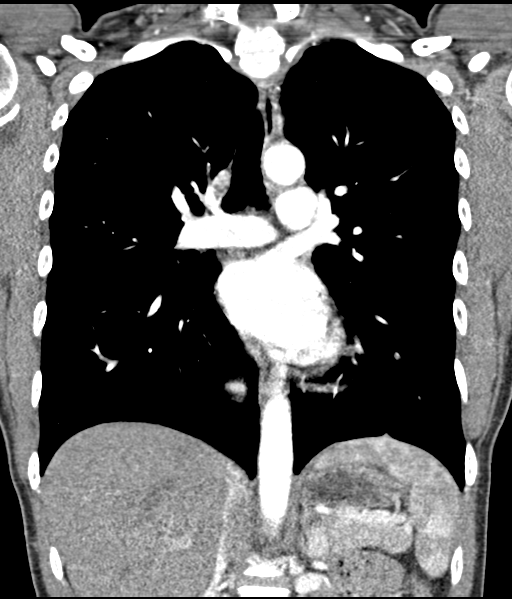
[im 77/103  soft-tissue]
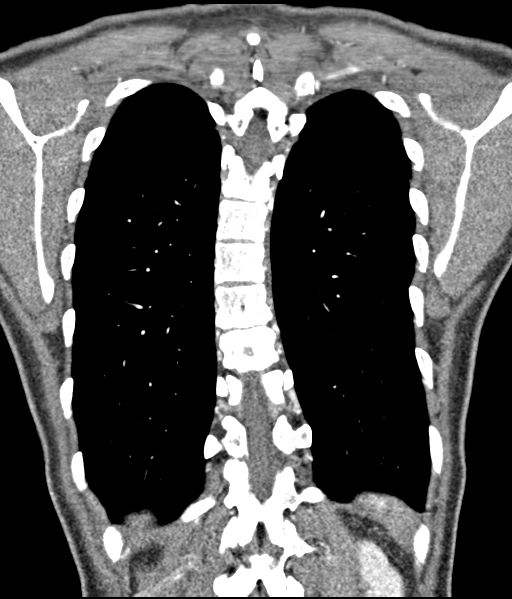

[18 of 46 positions shown; findings below may reference images not displayed]

FINDINGS: Cardiovascular:

Heart:

No cardiomegaly. No pericardial fluid/thickening. No significant
coronary calcifications.

Aorta:

Coronal images demonstrate annulus estimated 22 mm.

Axial images demonstrate estimated diameter of the ascending aorta
36 mm.

No dissection.  No atherosclerotic changes.

Common origin of the left common carotid artery and the innominate
artery.

Unremarkable course caliber contour of the distal thoracic aorta.

No significant atherosclerotic changes of the thoracic aorta.

Pulmonary arteries:

Timing of the contrast bolus not optimized for pulmonary arteries.

Mediastinum/Nodes: No mediastinal adenopathy. Unremarkable
appearance of the thoracic esophagus.

Unremarkable appearance of the thoracic inlet and thyroid.

Lungs/Pleura: Central airways are clear. No pleural effusion. No
confluent airspace disease.

No pneumothorax.

Upper Abdomen: No acute.

Musculoskeletal: No acute displaced fracture. Mild apex right
scoliotic curvature of the thoracic spine.

Review of the MIP images confirms the above findings.
IMPRESSION: Maximum estimated diameter of the ascending aorta is 3.6 cm.

Mild scoliotic curvature of the thoracic spine.

## 2022-02-10 ENCOUNTER — Ambulatory Visit: Payer: No Typology Code available for payment source | Admitting: Dermatology

## 2022-04-28 ENCOUNTER — Ambulatory Visit (INDEPENDENT_AMBULATORY_CARE_PROVIDER_SITE_OTHER): Payer: No Typology Code available for payment source | Admitting: Dermatology

## 2022-04-28 DIAGNOSIS — Z1283 Encounter for screening for malignant neoplasm of skin: Secondary | ICD-10-CM

## 2022-05-19 ENCOUNTER — Encounter: Payer: Self-pay | Admitting: Dermatology

## 2022-05-19 NOTE — Progress Notes (Signed)
   Follow-Up Visit   Subjective  Brandy Ramirez is a 36 y.o. female who presents for the following: Annual Exam (Here for yearly skin exam. No concerns and no personal history of skin cancer or atypia ).  General skin examination. Location:  Duration:  Quality:  Associated Signs/Symptoms: Modifying Factors:  Severity:  Timing: Context:   Objective  Well appearing patient in no apparent distress; mood and affect are within normal limits. No atypical nevi or signs of NMSC noted at the time of the visit. No personal history of cancer or atypia. Paint on the toe nail unable to follow up. SGHP on the forehead      A full examination was performed including scalp, head, eyes, ears, nose, lips, neck, chest, axillae, abdomen, back, buttocks, bilateral upper extremities, bilateral lower extremities, hands, feet, fingers, toes, fingernails, and toenails. All findings within normal limits unless otherwise noted below.  Areas beneath undergarments not fully examined.   Assessment & Plan    Screening exam for skin cancer  Right back a larger smudgy mole that with the dermatoscope shows no atypia.  Discussed elective biopsy, will leave unless there is clinical change.  Keep yearly skin check patient has a lot of moles.  We will continue to self examine with spouse twice annually.  Continue ultraviolet protection.      I, Lavonna Monarch, MD, have reviewed all documentation for this visit.  The documentation on 05/19/22 for the exam, diagnosis, procedures, and orders are all accurate and complete.

## 2022-12-18 ENCOUNTER — Ambulatory Visit: Payer: No Typology Code available for payment source | Admitting: Physician Assistant

## 2023-01-07 ENCOUNTER — Ambulatory Visit: Payer: No Typology Code available for payment source | Admitting: Physician Assistant

## 2023-01-29 ENCOUNTER — Encounter: Payer: Self-pay | Admitting: Physician Assistant

## 2023-01-29 ENCOUNTER — Ambulatory Visit: Payer: No Typology Code available for payment source | Admitting: Physician Assistant

## 2023-01-29 VITALS — BP 122/78 | HR 61 | Ht 63.0 in | Wt 131.0 lb

## 2023-01-29 DIAGNOSIS — K625 Hemorrhage of anus and rectum: Secondary | ICD-10-CM | POA: Diagnosis not present

## 2023-01-29 NOTE — Patient Instructions (Addendum)
_______________________________________________________  If your blood pressure at your visit was 140/90 or greater, please contact your primary care physician to follow up on this.  _______________________________________________________  If you are age 38 or older, your body mass index should be between 23-30. Your Body mass index is 23.21 kg/m. If this is out of the aforementioned range listed, please consider follow up with your Primary Care Provider.  If you are age 89 or younger, your body mass index should be between 19-25. Your Body mass index is 23.21 kg/m. If this is out of the aformentioned range listed, please consider follow up with your Primary Care Provider.   ________________________________________________________  The [facility] providers would like to encourage you to use [facility] to communicate with providers for non-urgent requests or questions.  Due to long hold times on the telephone, sending your provider a message by [facility] may be a faster and more efficient way to get a response.  Please allow 48 business hours for a response.  Please remember that this is for non-urgent requests.  _______________________________________________________  A high fiber diet with plenty of fluids (up to 8 glasses of water daily) is suggested to relieve these symptoms.  Metamucil, 1 tablespoon once or twice daily can be used to keep bowels regular if needed.   Start Benefiber or Citrucel 2 teaspoons in 8 ounces of liquid daily and may increase to twice daily if tolerated.

## 2023-01-29 NOTE — Progress Notes (Signed)
Chief Complaint: Rectal bleeding  HPI:    Brandy Ramirez is a 37 year old Caucasian female with a past medical history as listed below, known to Dr. Henrene Pastor, who was referred to me by Vicenta Aly, [place] for a complaint of rectal bleeding.      06/13/2020 patient saw Tye Savoy for chronic intermittent, migratory abdominal pain/chest pain with an unclear etiology.  At that time described chronic loose stools following cholecystectomy, stress could exacerbate symptoms.  Cardiac eval of chest pain was in progress at that time she was wearing cardiac monitor.  She was given a trial of Bentyl 10 mg twice daily as needed for lower abdominal pain.  She is also given a trial of a course of PPI.  Is recommend that she have an EGD if there was no improvement.  She is also given Questran.    06/29/2020 patient had a surgical consult for a thoracic aortic aneurysm.  It was recommended she have a repeat CTA in 1 year.    09/26/2020 office visit with me for atypical chest pain.  At that time recommended an EGD and prescribed Omeprazole 20 mg daily.    12/04/2020 EGD with Dr. Henrene Pastor with mild reflux esophagitis.  At that time recommended Omeprazole 20 mg daily to see if it helped with atypical chest pain.    11/28/2022 patient seen by PCP and discussed some blood in her stool.  CBC and CMP normal at that time.  TSH normal.    Today, the patient presents to clinic and tells me about a month ago she had some bright red blood on the toilet paper, looked like mixed in with her stool on 1 occasion when she was a little bit constipated.  Since then she has not noticed any and has maintained her regular bowel habits which vary from sometimes looser stool to constipation.  Tells me that she is on a high-protein diet and works out often.  In general she feels well with no abdominal pain, rectal pain or irritation or unexpected weight loss.  No family history of colon cancer.  Tells me she is never felt any hemorrhoids or  acknowledge that she had hemorrhoids in the past.    Denies fever or chills.  Past Medical History:  Diagnosis Date   Atypical chest pain    Elevated C-reactive protein (CRP)    Myalgia    No pertinent past medical history    Normal pregnancy in multigravida in third trimester 12/02/2014   SVD (spontaneous vaginal delivery) 12/03/2014   Tachycardia    Thoracic aortic aneurysm ([place])    follows woth Cardio- Echo EF 62 % no AVS    Past Surgical History:  Procedure Laterality Date   CHOLECYSTECTOMY N/A 03/03/2018   Procedure: LAPAROSCOPIC CHOLECYSTECTOMY;  Surgeon: Rolm Bookbinder, MD;  Location: [facility];  Service: General;  Laterality: N/A;   WISDOM TOOTH EXTRACTION      Current Outpatient Medications  Medication Sig Dispense Refill   amoxicillin (AMOXIL) 875 MG tablet Take 1 tablet (875 mg total) by mouth 2 (two) times daily with a meal. 14 tablet 0   Apple Cider Vinegar 600 MG CAPS Take 1 capsule by mouth daily.     benzonatate (TESSALON) 100 MG capsule Take 1-2 capsules (100-200 mg total) by mouth 3 (three) times daily as needed for cough. 60 capsule 0   cetirizine (ZYRTEC ALLERGY) 10 MG tablet Take 1 tablet (10 mg total) by mouth daily. 30 tablet 0   famotidine (PEPCID) 20 MG  tablet Take 20 mg by mouth 2 (two) times daily. As needed     Multiple Vitamin (MULTIVITAMIN) tablet Take 1 tablet by mouth every other day.      promethazine-dextromethorphan (PROMETHAZINE-DM) 6.25-15 MG/5ML syrup Take 5 mLs by mouth at bedtime as needed for cough. 100 mL 0   pseudoephedrine (SUDAFED) 30 MG tablet Take 1 tablet (30 mg total) by mouth every 8 (eight) hours as needed for congestion. 30 tablet 0   No current facility-administered medications for this visit.    Allergies as of 01/29/2023   (No Known Allergies)    Family History  Problem Relation Age of Onset   Heart attack Paternal Grandfather    Melanoma Mother        stage IV    Coronary artery disease Father    Heart attack Father     Transient ischemic attack Father    Colon polyps Father    Birth defects Daughter        VSD-closed   Colon cancer Maternal Grandmother    Non-Hodgkin's lymphoma Paternal Grandmother    Esophageal cancer Neg Hx    Rectal cancer Neg Hx    Stomach cancer Neg Hx     Social History   Socioeconomic History   Marital status: Married    Spouse name: drew   Number of children: 3   Years of education: 16   Highest education level: Not on file  Occupational History   Occupation: registered Optician, dispensing: Theme park manager  Tobacco Use   Smoking status: Never   Smokeless tobacco: Never  Vaping Use   Vaping Use: Never used  Substance and Sexual Activity   Alcohol use: Yes    Comment: wine 1-2 times a month   Drug use: No   Sexual activity: Yes    Partners: Male    Birth control/protection: Surgical    Comment: vasectomy  Other Topics Concern   Not on file  Social History Narrative   Likes to walk   Married, Therapist, sports, 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   Former Quarry manager at [employer]   2 children   Social Determinants of Radio broadcast assistant Strain: Not on Art therapist Insecurity: Not on file  Transportation Needs: Not on file  Physical Activity: Not on file  Stress: Not on file  Social Connections: Not on file  Intimate Partner Violence: Not on file    Review of Systems:    Constitutional: No weight loss, fever or chills Skin: No rash  Cardiovascular: No chest pain  Respiratory: No SOB  Gastrointestinal: See HPI and otherwise negative Genitourinary: No dysuria  Neurological: No headache, dizziness or syncope Musculoskeletal: No new muscle or joint pain Hematologic: No bruising Psychiatric: No history of depression or anxiety   Physical Exam:  Vital signs: BP 122/78   Pulse 61   Ht 5' 3"$  (1.6 m)   Wt 131 lb (59.4 kg)   BMI 23.21 kg/m    Constitutional:   Pleasant Caucasian female appears to be in NAD, Well developed, Well  nourished, alert and cooperative Head:  Normocephalic and atraumatic. Eyes:   PEERL, EOMI. No icterus. Conjunctiva pink. Ears:  Normal auditory acuity. Neck:  Supple Throat: Oral cavity and pharynx without inflammation, swelling or lesion.  Respiratory: Respirations even and unlabored. Lungs clear to auscultation bilaterally.   No wheezes, crackles, or rhonchi.  Cardiovascular: Normal S1, S2. No MRG. Regular rate and rhythm. No peripheral edema, cyanosis  or pallor.  Gastrointestinal:  Soft, nondistended, nontender. No rebound or guarding. Normal bowel sounds. No appreciable masses or hepatomegaly. Rectal:  Declined Msk:  Symmetrical without gross deformities. Without edema, no deformity or joint abnormality.  Neurologic:  Alert and  oriented x4;  grossly normal neurologically.  Skin:   Dry and intact without significant lesions or rashes. Psychiatric: Demonstrates good judgement and reason without abnormal affect or behaviors.  RELEVANT LABS AND IMAGING: CBC    Component Value Date/Time   WBC 5.9 02/22/2018 0840   RBC 4.44 02/22/2018 0840   HGB 13.2 02/22/2018 0840   HCT 40.3 02/22/2018 0840   PLT 266 02/22/2018 0840   MCV 90.8 02/22/2018 0840   MCH 29.7 02/22/2018 0840   MCHC 32.8 02/22/2018 0840   RDW 11.8 02/22/2018 0840   LYMPHSABS 2.1 08/22/2009 2124   MONOABS 0.6 08/22/2009 2124   EOSABS 0.1 08/22/2009 2124   BASOSABS 0.0 08/22/2009 2124    CMP     Component Value Date/Time   NA 138 10/15/2019 1234   K 4.0 10/15/2019 1234   CL 104 10/15/2019 1234   CO2 26 10/15/2019 1234   GLUCOSE 90 10/15/2019 1234   BUN 11 10/15/2019 1234   CREATININE 0.72 10/15/2019 1234   CALCIUM 9.4 10/15/2019 1234   PROT 7.0 01/21/2018 1514   ALBUMIN 4.3 01/21/2018 1514   AST 19 01/21/2018 1514   ALT 13 (L) 01/21/2018 1514   ALKPHOS 58 01/21/2018 1514   BILITOT 0.5 01/21/2018 1514   GFRNONAA >60 10/15/2019 1234   GFRAA >60 10/15/2019 1234    Assessment: 1.  Rectal bleeding: one  instance about 2 months ago mixed in with the stool and on the toilet paper, none since then, does radiate from diarrhea to constipation at times, on a high-protein diet with minimal fiber, no family history of colon cancer, no red flags, declined rectal exam today; most likely internal hemorrhoids versus AVM versus polyp versus less likely colon cancer  Plan: 1.  Offered the patient a colonoscopy for further evaluation but she declines at this point.  Tells me that she will let us know if it occurs again or more frequently or anything else changes. 2.  Discussed the likelihood of internal hemorrhoids.  Recommended she start a fiber supplement such as Benefiber or Metamucil 1 tablespoon daily.  This will help with the variance in her stools as well. 3.  Discussed possible Hydrocortisone suppositories in the future, for now she declines. 4.  Patient will follow in clinic with [place] as needed.  Ellouise Newer, PA-C [facility] 01/29/2023, 11:12 AM  Cc: Vicenta Aly, [place]

## 2023-02-06 NOTE — Progress Notes (Signed)
Noted. Limited assessment without rectal exam or anoscopy (told that patient declines). Will need further assessment, possibly colonoscopy if bleeding recurs.

## 2023-04-29 ENCOUNTER — Ambulatory Visit: Payer: No Typology Code available for payment source | Admitting: Dermatology

## 2023-05-21 ENCOUNTER — Other Ambulatory Visit: Payer: Self-pay | Admitting: Surgery

## 2023-05-21 DIAGNOSIS — I7121 Aneurysm of the ascending aorta, without rupture: Secondary | ICD-10-CM

## 2023-07-08 ENCOUNTER — Ambulatory Visit
Admission: RE | Admit: 2023-07-08 | Discharge: 2023-07-08 | Disposition: A | Discharge status: Home / Self Care | Payer: No Typology Code available for payment source | Source: Ambulatory Visit | Attending: Surgery | Admitting: Surgery

## 2023-07-08 ENCOUNTER — Ambulatory Visit: Payer: No Typology Code available for payment source | Admitting: Surgery

## 2023-07-08 DIAGNOSIS — I7121 Aneurysm of the ascending aorta, without rupture: Secondary | ICD-10-CM

## 2023-07-08 MED ORDER — IOPAMIDOL (ISOVUE-370) INJECTION 76%
75.0000 mL | Freq: Once | INTRAVENOUS | Status: AC | PRN
  Administered 2023-07-08: 75 mL via INTRAVENOUS

## 2023-07-10 ENCOUNTER — Encounter: Payer: Self-pay | Admitting: Surgery

## 2023-07-10 ENCOUNTER — Ambulatory Visit: Payer: No Typology Code available for payment source | Admitting: Surgery

## 2023-07-10 VITALS — BP 143/90 | HR 57 | Resp 20 | Ht 63.0 in | Wt 126.0 lb

## 2023-07-10 DIAGNOSIS — I7121 Aneurysm of the ascending aorta, without rupture: Secondary | ICD-10-CM

## 2023-07-10 NOTE — Progress Notes (Signed)
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  Her distal descending aorta as measured about 2.0 to 2.1 cm.  She continues to feel well overall.  She has a history of hypertension but only occasionally checks her blood pressure at home.  Current Outpatient Medications  Medication Sig Dispense Refill   famotidine (PEPCID) 40 MG tablet Take 40 mg by mouth daily as needed for heartburn or indigestion.     Multiple Vitamin (MULTIVITAMIN) tablet Take 1 tablet by mouth every other day.     No current facility-administered medications for this visit.     Physical Exam: BP (!) 143/90 (BP Location: Left Arm, Patient Position: Sitting, Cuff Size: Normal)   Pulse (!) 57   Resp 20   Ht 5\' 3"  (1.6 m)   Wt 126 lb (57.2 kg)   SpO2 99% Comment: RA  BMI 22.32 kg/m    Diagnostic Tests:  Narrative & Impression  CLINICAL DATA:  Ascending thoracic aortic aneurysm.   EXAM: CT ANGIOGRAPHY CHEST WITH CONTRAST   TECHNIQUE: Multidetector CT imaging of the chest was performed using the standard protocol during bolus administration of intravenous contrast. Multiplanar CT image reconstructions and MIPs were obtained to evaluate the vascular anatomy.   RADIATION DOSE REDUCTION: This exam was performed according to the departmental dose-optimization program which includes automated exposure control, adjustment of the mA and/or kV according to patient size and/or use of iterative reconstruction technique.   CONTRAST:  75mL ISOVUE-370 IOPAMIDOL (ISOVUE-370) INJECTION 76%   COMPARISON:  July 10, 2021.   FINDINGS: Cardiovascular: Preferential opacification of the thoracic aorta. No evidence of thoracic aortic aneurysm or dissection. Normal heart size. No pericardial effusion. Maximum measured diameter of ascending thoracic aorta is 3.6 cm which is within normal  limits.   Mediastinum/Nodes: No enlarged mediastinal, hilar, or axillary lymph nodes. Thyroid gland, trachea, and esophagus demonstrate no significant findings.   Lungs/Pleura: Lungs are clear. No pleural effusion or pneumothorax.   Upper Abdomen: No acute abnormality.   Musculoskeletal: No chest wall abnormality. No acute or significant osseous findings.   Review of the MIP images confirms the above findings.   IMPRESSION: No evidence of thoracic aortic aneurysm. No definite abnormality seen in the chest.     Electronically Signed   By: Lupita Raider M.D.   On: 07/08/2023 14:57      Impression:  She has a 3.6 cm fusiform enlargement of the ascending aorta which has been stable.  Her descending aorta at the same level measured 2.0 cm.  I reviewed the CT images with her and answered her questions.  I stressed the importance of continued good blood pressure control in preventing further enlargement and acute aortic dissection.  I advised her to check her blood pressure at home periodically with a goal of 130/80 or less.  I advised her against doing any heavy lifting that may require a Valsalva maneuver and could suddenly raise her blood pressure to high levels.  Plan:  I will see her back in 2 years with an MRA of the chest for aortic surveillance.  I spent 10 minutes performing this established patient evaluation and > 50% of this time was spent face to face counseling and coordinating the care of this patient's aortic enlargement.    Alleen Borne, MD [facility] [phone]
